# Patient Record
Sex: Female | Born: 1960 | Race: White | Hispanic: No | Smoking: Never smoker
Health system: Southern US, Community
[De-identification: ages and names within clinical notes are randomized; demographics above are authoritative.]

## PROBLEM LIST (undated history)

## (undated) DIAGNOSIS — E049 Nontoxic goiter, unspecified: Secondary | ICD-10-CM

## (undated) HISTORY — PX: SKIN CANCER EXCISION: SHX779

## (undated) HISTORY — DX: Nontoxic goiter, unspecified: E04.9

---

## 2003-05-24 ENCOUNTER — Encounter: Admission: RE | Admit: 2003-05-24 | Discharge: 2003-05-24 | Payer: Self-pay | Admitting: Internal Medicine

## 2004-02-22 ENCOUNTER — Ambulatory Visit: Payer: Self-pay | Admitting: Internal Medicine

## 2004-06-25 ENCOUNTER — Ambulatory Visit: Payer: Self-pay | Admitting: Internal Medicine

## 2005-10-21 ENCOUNTER — Ambulatory Visit: Payer: Self-pay | Admitting: Internal Medicine

## 2005-10-28 ENCOUNTER — Ambulatory Visit: Payer: Self-pay | Admitting: Internal Medicine

## 2005-11-05 ENCOUNTER — Ambulatory Visit: Payer: Self-pay | Admitting: Internal Medicine

## 2005-11-12 ENCOUNTER — Encounter: Admission: RE | Admit: 2005-11-12 | Discharge: 2005-11-12 | Payer: Self-pay | Admitting: Internal Medicine

## 2005-11-12 ENCOUNTER — Encounter: Payer: Self-pay | Admitting: Internal Medicine

## 2007-03-10 ENCOUNTER — Telehealth (INDEPENDENT_AMBULATORY_CARE_PROVIDER_SITE_OTHER): Payer: Self-pay | Admitting: *Deleted

## 2007-03-31 ENCOUNTER — Ambulatory Visit: Payer: Self-pay | Admitting: Internal Medicine

## 2007-03-31 DIAGNOSIS — E042 Nontoxic multinodular goiter: Secondary | ICD-10-CM | POA: Insufficient documentation

## 2007-04-05 LAB — CONVERTED CEMR LAB
Eosinophils Absolute: 0 10*3/uL (ref 0.0–0.6)
Eosinophils Relative: 0.5 % (ref 0.0–5.0)
GFR calc Af Amer: 87 mL/min
GFR calc non Af Amer: 72 mL/min
Glucose, Bld: 96 mg/dL (ref 70–99)
HCT: 39.5 % (ref 36.0–46.0)
HDL: 58.5 mg/dL (ref >39.0)
Lymphocytes Relative: 17.3 % (ref 12.0–46.0)
MCV: 93.3 fL (ref 78.0–100.0)
Monocytes Absolute: 0.3 10*3/uL (ref 0.2–0.7)
Neutro Abs: 4.1 10*3/uL (ref 1.4–7.7)
Neutrophils Relative %: 77 % (ref 43.0–77.0)
Platelets: 202 10*3/uL (ref 150–400)
Potassium: 5.1 meq/L (ref 3.5–5.1)
Sodium: 141 meq/L (ref 135–145)
WBC: 5.3 10*3/uL (ref 4.5–10.5)

## 2007-05-09 ENCOUNTER — Telehealth (INDEPENDENT_AMBULATORY_CARE_PROVIDER_SITE_OTHER): Payer: Self-pay | Admitting: *Deleted

## 2007-05-11 ENCOUNTER — Encounter: Admission: RE | Admit: 2007-05-11 | Discharge: 2007-05-11 | Payer: Self-pay | Admitting: Internal Medicine

## 2007-05-12 ENCOUNTER — Encounter (INDEPENDENT_AMBULATORY_CARE_PROVIDER_SITE_OTHER): Payer: Self-pay | Admitting: *Deleted

## 2007-05-13 ENCOUNTER — Telehealth (INDEPENDENT_AMBULATORY_CARE_PROVIDER_SITE_OTHER): Payer: Self-pay | Admitting: *Deleted

## 2007-05-24 ENCOUNTER — Ambulatory Visit: Payer: Self-pay | Admitting: Internal Medicine

## 2007-06-06 ENCOUNTER — Telehealth (INDEPENDENT_AMBULATORY_CARE_PROVIDER_SITE_OTHER): Payer: Self-pay | Admitting: *Deleted

## 2007-07-07 ENCOUNTER — Ambulatory Visit: Payer: Self-pay | Admitting: Internal Medicine

## 2008-03-25 LAB — HM MAMMOGRAPHY: HM Mammogram: NORMAL

## 2009-02-12 ENCOUNTER — Ambulatory Visit: Payer: Self-pay | Admitting: Internal Medicine

## 2009-02-18 ENCOUNTER — Ambulatory Visit: Payer: Self-pay | Admitting: Internal Medicine

## 2009-02-20 LAB — CONVERTED CEMR LAB
ALT: 27 units/L (ref 0–35)
AST: 24 units/L (ref 0–37)
Basophils Absolute: 0 10*3/uL (ref 0.0–0.1)
CO2: 28 meq/L (ref 19–32)
Calcium: 9 mg/dL (ref 8.4–10.5)
Chloride: 108 meq/L (ref 96–112)
Eosinophils Absolute: 0.1 10*3/uL (ref 0.0–0.7)
HDL: 54.3 mg/dL (ref >39.00)
LDL Cholesterol: 118 mg/dL — ABNORMAL HIGH (ref 0–99)
Lymphocytes Relative: 27.5 % (ref 12.0–46.0)
Lymphs Abs: 1.2 10*3/uL (ref 0.7–4.0)
Monocytes Relative: 9.1 % (ref 3.0–12.0)
Platelets: 160 10*3/uL (ref 150.0–400.0)
RDW: 12.3 % (ref 11.5–14.6)
Sodium: 142 meq/L (ref 135–145)
TSH: 6.45 microintl units/mL — ABNORMAL HIGH (ref 0.35–5.50)
Total CHOL/HDL Ratio: 3
Triglycerides: 53 mg/dL (ref 0.0–149.0)

## 2009-04-05 ENCOUNTER — Telehealth (INDEPENDENT_AMBULATORY_CARE_PROVIDER_SITE_OTHER): Payer: Self-pay | Admitting: *Deleted

## 2009-04-12 ENCOUNTER — Ambulatory Visit: Payer: Self-pay | Admitting: Internal Medicine

## 2009-10-17 ENCOUNTER — Telehealth (INDEPENDENT_AMBULATORY_CARE_PROVIDER_SITE_OTHER): Payer: Self-pay | Admitting: *Deleted

## 2009-10-18 ENCOUNTER — Ambulatory Visit: Payer: Self-pay | Admitting: Internal Medicine

## 2010-03-02 ENCOUNTER — Encounter: Payer: Self-pay | Admitting: Internal Medicine

## 2010-03-11 NOTE — Progress Notes (Signed)
Summary: lab   Phone Note Outgoing Call   Summary of Call: pls schedule lab for TSH-244.9 due for recheck.Marland KitchenMarland KitchenDoristine Devoid  April 05, 2009 4:16 PM   Follow-up for Phone Call        [facility] .Marland KitchenHarold Barban  April 05, 2009 4:19 PM    Additional Follow-up for Phone Call Additional follow up Details #2::    pt has ann appt on March 4,2011 Follow-up by: Barb Merino,  April 08, 2009 9:03 AM

## 2010-03-11 NOTE — Progress Notes (Signed)
Summary: FOR LAB TSH, USE 240.9  Phone Note Call from Patient   Summary of Call: PATIENT CALLED TO SCHEDULE LAB APPT TO RECHK TSH--PER DANIELLE, USE 240.9 Initial call taken by: Jerolyn Shin,  October 17, 2009 11:07 AM

## 2010-03-11 NOTE — Assessment & Plan Note (Signed)
Summary: cpx/ns/kdc   Vital Signs:  Patient profile:   50 year old female LMP:     02/01/2009 Height:      67 inches Weight:      137.2 pounds BMI:     21.57 Temp:     98.4 degrees F Pulse rate:   60 / minute BP sitting:   110 / 80  Vitals Entered By: Shary Decamp (February 12, 2009 1:56 PM) CC: cpx - not fasting Is Patient Diabetic? No LMP (date): 02/01/2009     Enter LMP: 02/01/2009 Last PAP Result normal   History of Present Illness: CPX self goiter exam unchanged   Preventive Screening-Counseling & Management  Alcohol-Tobacco     Alcohol drinks/day: socially     Smoking Status: never  Caffeine-Diet-Exercise     Caffeine use/day: 3 cup coffee/wk     Does Patient Exercise: yes     Times/week: 3  Current Medications (verified): 1)  Levothroid 100 Mcg  Tabs (Levothyroxine Sodium) .Marland Kitchen.. 1 By Mouth Once Daily, Must Have Ov For Additional Refills 2)  Nuvaring 0.12-0.015 Mg/24hr  Ring (Etonogestrel-Ethinyl Estradiol)  Allergies (verified): No Known Drug Allergies  Comments:  Nurse/Medical Assistant: The patient's medications and allergies were reviewed with the patient and were updated in the Medication and Allergy Lists. Shary Decamp (February 12, 2009 2:05 PM)  Past History:  Past Medical History: remote Dx of goiter, u/s 2007, repeated 05-2007  stable G1 P1  Past Surgical History: skin Ca - removed rt arm/rt leg 2008  Sees derm 4 times a year  Family History: Reviewed history from 03/31/2007 and no changes required. CAD - no HTN - no Stroke - no colon Ca - brother  breast Ca - MGM (87 y/o) DM - no M- living, excellent health F - living, excellent health  Social History: Reviewed history from 03/31/2007 and no changes required. 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 CV:  Denies chest pain or discomfort and swelling of feet. Resp:  Denies cough and shortness of breath. GI:  Denies bloody stools, nausea, and vomiting. GU:  Denies dysuria and urinary hesitancy. Psych:  Denies anxiety and depression.  Physical Exam  General:  alert, well-developed, and well-nourished.   Neck:  no masses.   Thyroid: smooth and enlarged at L, no nodular or tender Lungs:  normal respiratory effort, no intercostal retractions, no accessory muscle use, and normal breath sounds.   Heart:  normal rate, regular rhythm, no murmur, and no gallop.   Abdomen:  soft, non-tender, no distention, no masses, no guarding, and no rigidity.   Extremities:  no pretibial edema bilaterally  Psych:  Cognition and judgment appear intact. Alert and cooperative with normal attention span and concentration.  not anxious appearing and not depressed appearing.     Impression & Recommendations:  Problem # 1:  HEALTH SCREENING (ICD-V70.0) TD 2009 flu shot today doing great, encouraged more exercise  has a gyn  cscope 06-2007, neg, next 5 years d/t FH  cont healthy life style    Problem # 2:  GOITER, UNSPECIFIED (ICD-240.9) labs  Complete Medication List: 1)  Levothroid 100 Mcg Tabs (Levothyroxine sodium) .Marland Kitchen.. 1 by mouth once daily, must have ov for additional refills 2)  Nuvaring 0.12-0.015 Mg/24hr Ring (Etonogestrel-ethinyl estradiol)  Other Orders:  Admin 1st Vaccine (04540) Flu Vaccine 33yrs + (98119) Flu Vaccine Consent Questions     Do you have a history of severe allergic reactions to this vaccine? no    Any prior history of allergic reactions to egg and/or gelatin? no    Do you have a sensitivity to the preservative Thimersol? no    Do you have a past history of Guillan-Barre Syndrome? no    Do you currently have an acute febrile illness? no    Have you ever had a severe reaction to latex? no    Vaccine information  given and explained to patient? yes    Are you currently pregnant? no    Lot Number:[id number]   Exp Date:08/08/2009   Site Given  Left Deltoid IM  Patient Instructions: 1)  came back fasting: 2)  FLP CBC BMP AST ALT Dx V70 3)  TSH  Dx Goiter  4)  Please schedule a follow-up appointment in 1 year.  .lbflu   Preventive Care Screening  Pap Smear:    Date:  09/09/2008    Results:  normal   Mammogram:    Date:  03/25/2008    Results:  normal   Prior Values:    Pap Smear:  normal (07/11/2006)    Mammogram:  normal (11/10/2006)    Colonoscopy:  Location:  [facility].   (07/07/2007)    Last Tetanus Booster:  less than 10 years (03/31/2007)

## 2010-04-08 ENCOUNTER — Telehealth (INDEPENDENT_AMBULATORY_CARE_PROVIDER_SITE_OTHER): Payer: Self-pay | Admitting: *Deleted

## 2010-04-17 NOTE — Progress Notes (Signed)
Summary: need lab orders for 3/20--cannot  get early pr Dr Drue Novel  Phone Note Call from Patient   Caller: Patient Summary of Call: has appt for physical for 05/06/2010---can she come in one week earlier on 3/20 for labs?    if yes, then what labs and what codes please?? Initial call taken by: Jerolyn Shin,  April 08, 2010 11:11 AM  Follow-up for Phone Call        Does this patient have medicare? It is standard that those who do not have Medicare can do their labs early.  Code for physical has not changed, v70.0.   MD please specify labs. Follow-up by: Lucious Groves CMA,  April 08, 2010 11:48 AM    Additional Follow-up for Phone Call Additional follow up Details #2::    we are transitioning to a new system, explain patient will get labs @ the time of the visit. If so desire, she can get a rainbow in AM and visit later that day Jose E. Paz MD  April 10, 2010 5:41 PM   Additional Follow-up for Phone Call Additional follow up Details #3:: Details for Additional Follow-up Action Taken: left detailed message for patient to call us back if she wants to come in early for "rainbow" labs, advised her that she can have labs on day of physical or she can come back another day after her physical for her lab work.Jerolyn Shin  April 11, 2010 12:32 PM

## 2010-04-22 ENCOUNTER — Encounter: Payer: Self-pay | Admitting: Internal Medicine

## 2010-05-06 ENCOUNTER — Ambulatory Visit (INDEPENDENT_AMBULATORY_CARE_PROVIDER_SITE_OTHER): Payer: BC Managed Care – PPO | Admitting: Internal Medicine

## 2010-05-06 ENCOUNTER — Encounter: Payer: Self-pay | Admitting: Internal Medicine

## 2010-05-06 DIAGNOSIS — E049 Nontoxic goiter, unspecified: Secondary | ICD-10-CM

## 2010-05-06 DIAGNOSIS — Z136 Encounter for screening for cardiovascular disorders: Secondary | ICD-10-CM

## 2010-05-06 DIAGNOSIS — Z0000 Encounter for general adult medical examination without abnormal findings: Secondary | ICD-10-CM | POA: Insufficient documentation

## 2010-05-06 LAB — CBC WITH DIFFERENTIAL/PLATELET
Basophils Absolute: 0 10*3/uL (ref 0.0–0.1)
Eosinophils Absolute: 0.1 10*3/uL (ref 0.0–0.7)
Lymphocytes Relative: 19.8 % (ref 12.0–46.0)
MCHC: 34.3 g/dL (ref 30.0–36.0)
MCV: 92.1 fl (ref 78.0–100.0)
Monocytes Absolute: 0.3 10*3/uL (ref 0.1–1.0)
Neutrophils Relative %: 73.4 % (ref 43.0–77.0)
Platelets: 200 10*3/uL (ref 150.0–400.0)
RBC: 4.51 Mil/uL (ref 3.87–5.11)
RDW: 12.8 % (ref 11.5–14.6)

## 2010-05-06 LAB — BASIC METABOLIC PANEL
Chloride: 108 mEq/L (ref 96–112)
GFR: 82.4 mL/min (ref >60.00)
Potassium: 5.1 mEq/L (ref 3.5–5.1)
Sodium: 141 mEq/L (ref 135–145)

## 2010-05-06 LAB — LDL CHOLESTEROL, DIRECT: Direct LDL: 121.8 mg/dL

## 2010-05-06 LAB — LIPID PANEL
Cholesterol: 212 mg/dL — ABNORMAL HIGH (ref 0–200)
HDL: 72.8 mg/dL (ref >39.00)
Triglycerides: 97 mg/dL (ref 0.0–149.0)
VLDL: 19.4 mg/dL (ref 0.0–40.0)

## 2010-05-06 LAB — AST: AST: 20 U/L (ref 0–37)

## 2010-05-06 NOTE — Assessment & Plan Note (Addendum)
TD 2009 has a gyn  cscope 06-2007, neg, next 5 years d/t FH  EKG NSR cont healthy life style!

## 2010-05-06 NOTE — Assessment & Plan Note (Signed)
Labs, repeat u/s self goiter exam is unchanged

## 2010-05-06 NOTE — Progress Notes (Signed)
  Subjective:    Patient ID: Deborah Yates, female    DOB: [birth year], 50 y.o.   MRN: [id number]  HPI CPX Doing well, exercise x5/week , slt discouraged b/c has not loss wt   Review of Systems  Constitutional: Negative for fever, fatigue and unexpected weight change.  Respiratory: Negative for cough, shortness of breath and wheezing.   Cardiovascular: Negative for chest pain, palpitations and leg swelling.  Gastrointestinal: Negative for nausea, vomiting, diarrhea and blood in stool.  Genitourinary:       Sees gyn  Psychiatric/Behavioral:       No anxiety, depression    Past Medical History  Diagnosis Date  . Goiter     remote, u/s 2007, repeated 05/2007- stable   Past Surgical History  Procedure Date  . Skin cancer excision     removed rt arm/rt leg 2008- sees derm 4 times a year   Family History: CAD - no HTN - no Stroke - no colon Ca - brother  breast Ca - MGM (55 y/o) DM - no M- living, excellent health F - living, excellent health  Social History: Divorced 1 daughter Haroldine Laws teacher--russian literature diet--healthy! exercise--  Very active!Tobacco-- no ETOH-- socially       Objective:   Physical Exam  Constitutional: She is oriented to person, place, and time. She appears well-developed and well-nourished.  Neck: Normal range of motion. Neck supple.       Soft enlargment  L thyroid, no tender, no nodular   Cardiovascular: Normal rate, regular rhythm and normal heart sounds.   Pulmonary/Chest: Effort normal and breath sounds normal. No respiratory distress. She has no wheezes. She has no rales.  Abdominal: Soft. Bowel sounds are normal. She exhibits no distension. There is no tenderness. There is no rebound and no guarding.  Musculoskeletal: She exhibits no edema.  Neurological: She is alert and oriented to person, place, and time. She has normal reflexes.  Psychiatric: She has a normal mood and affect. Her behavior is normal. Judgment normal.           Assessment & Plan:

## 2010-05-06 NOTE — Patient Instructions (Signed)
Will schedule a u/s

## 2010-05-07 ENCOUNTER — Telehealth: Payer: Self-pay | Admitting: *Deleted

## 2010-05-07 MED ORDER — LEVOTHYROXINE SODIUM 125 MCG PO TABS: 125.0000 ug | ORAL_TABLET | Freq: Every day | ORAL | Status: DC

## 2010-05-07 NOTE — Telephone Encounter (Signed)
Message copied by Army Fossa on Wed May 07, 2010 11:56 AM ------      Message from: Deborah Yates      Created: Tue May 06, 2010  8:59 PM       Advise patient:      All labs ok, just need to take less synthroid.      Currently on daily, change to daily      Arrange for a tsh in 2 months

## 2010-05-07 NOTE — Telephone Encounter (Signed)
Spoke w/ pt & she is aware

## 2010-05-07 NOTE — Telephone Encounter (Signed)
Left message for pt to call back  °

## 2010-05-09 ENCOUNTER — Other Ambulatory Visit: Payer: BC Managed Care – PPO

## 2010-06-18 ENCOUNTER — Ambulatory Visit
Admission: RE | Admit: 2010-06-18 | Discharge: 2010-06-18 | Disposition: A | Discharge status: Home / Self Care | Payer: BC Managed Care – PPO | Source: Ambulatory Visit | Attending: Internal Medicine | Admitting: Internal Medicine

## 2010-06-18 DIAGNOSIS — E049 Nontoxic goiter, unspecified: Secondary | ICD-10-CM

## 2010-08-19 ENCOUNTER — Other Ambulatory Visit: Payer: Self-pay | Admitting: Internal Medicine

## 2010-08-20 ENCOUNTER — Other Ambulatory Visit: Payer: Self-pay | Admitting: Internal Medicine

## 2010-08-20 DIAGNOSIS — E039 Hypothyroidism, unspecified: Secondary | ICD-10-CM

## 2010-08-21 ENCOUNTER — Other Ambulatory Visit (INDEPENDENT_AMBULATORY_CARE_PROVIDER_SITE_OTHER): Payer: BC Managed Care – PPO

## 2010-08-21 DIAGNOSIS — E039 Hypothyroidism, unspecified: Secondary | ICD-10-CM

## 2010-08-21 LAB — TSH: TSH: 0.14 u[IU]/mL — ABNORMAL LOW (ref 0.35–5.50)

## 2010-08-21 NOTE — Progress Notes (Signed)
Labs only

## 2010-08-25 ENCOUNTER — Telehealth: Payer: Self-pay | Admitting: *Deleted

## 2010-08-25 MED ORDER — LEVOTHYROXINE SODIUM 100 MCG PO TABS: 100.0000 ug | ORAL_TABLET | Freq: Every day | ORAL | Status: DC

## 2010-08-25 NOTE — Telephone Encounter (Signed)
Message left for patient to return my call.  

## 2010-08-25 NOTE — Telephone Encounter (Signed)
Message copied by Leanne Lovely on Mon Aug 25, 2010  2:37 PM ------      Message from: Deborah Yates      Created: Mon Aug 25, 2010  1:24 PM       Advise patient, still needs to take less Synthroid, decreased to 100 mcg daily. Call in a new prescription, arrange a TSH in 6 weeks

## 2010-08-25 NOTE — Telephone Encounter (Signed)
Pt is aware.  

## 2010-09-20 ENCOUNTER — Other Ambulatory Visit: Payer: Self-pay | Admitting: Internal Medicine

## 2010-09-24 NOTE — Telephone Encounter (Signed)
Denied-last dosage in EMR chart on 08/25/2010 is daily.

## 2010-12-29 ENCOUNTER — Other Ambulatory Visit (INDEPENDENT_AMBULATORY_CARE_PROVIDER_SITE_OTHER): Payer: BC Managed Care – PPO

## 2010-12-29 ENCOUNTER — Other Ambulatory Visit: Payer: Self-pay | Admitting: Internal Medicine

## 2010-12-29 DIAGNOSIS — E049 Nontoxic goiter, unspecified: Secondary | ICD-10-CM

## 2011-04-13 ENCOUNTER — Other Ambulatory Visit: Payer: Self-pay | Admitting: Internal Medicine

## 2011-04-13 NOTE — Telephone Encounter (Signed)
Refill done.  

## 2011-05-19 ENCOUNTER — Telehealth: Payer: Self-pay | Admitting: Internal Medicine

## 2011-05-19 NOTE — Telephone Encounter (Signed)
Schedule a CPX, will do all labs then

## 2011-05-19 NOTE — Telephone Encounter (Signed)
It looks like the pt is due for a CPE as well as labs. Please advise.

## 2011-05-19 NOTE — Telephone Encounter (Signed)
CPE scheduled for July. [place].

## 2011-05-19 NOTE — Telephone Encounter (Signed)
Can you please call pt & schedule CPE? Thank you!

## 2011-05-19 NOTE — Telephone Encounter (Signed)
Patient stated that she is due for labs to continue getting her thyroid medications, can you please review the chart & get orders for labs & I will call the patient back to set up an appointment. Thanks Patient PH# [phone]

## 2011-05-28 ENCOUNTER — Other Ambulatory Visit: Payer: Self-pay | Admitting: Internal Medicine

## 2011-05-28 NOTE — Telephone Encounter (Signed)
Refill done.  

## 2011-08-26 ENCOUNTER — Ambulatory Visit (INDEPENDENT_AMBULATORY_CARE_PROVIDER_SITE_OTHER): Payer: BC Managed Care – PPO | Admitting: Internal Medicine

## 2011-08-26 ENCOUNTER — Encounter: Payer: Self-pay | Admitting: Internal Medicine

## 2011-08-26 VITALS — BP 104/68 | HR 66 | Temp 98.1°F | Ht 66.25 in | Wt 129.0 lb

## 2011-08-26 DIAGNOSIS — E049 Nontoxic goiter, unspecified: Secondary | ICD-10-CM

## 2011-08-26 DIAGNOSIS — Z0000 Encounter for general adult medical examination without abnormal findings: Secondary | ICD-10-CM

## 2011-08-26 LAB — CBC WITH DIFFERENTIAL/PLATELET
Basophils Absolute: 0 10*3/uL (ref 0.0–0.1)
Eosinophils Absolute: 0.1 10*3/uL (ref 0.0–0.7)
Hemoglobin: 13.6 g/dL (ref 12.0–15.0)
Lymphocytes Relative: 23 % (ref 12.0–46.0)
MCHC: 33 g/dL (ref 30.0–36.0)
Monocytes Relative: 5.4 % (ref 3.0–12.0)
Neutrophils Relative %: 69.8 % (ref 43.0–77.0)
RBC: 4.41 Mil/uL (ref 3.87–5.11)
RDW: 13.1 % (ref 11.5–14.6)

## 2011-08-26 LAB — COMPREHENSIVE METABOLIC PANEL
AST: 19 U/L (ref 0–37)
Albumin: 3.6 g/dL (ref 3.5–5.2)
Alkaline Phosphatase: 77 U/L (ref 39–117)
Glucose, Bld: 97 mg/dL (ref 70–99)
Potassium: 4.3 mEq/L (ref 3.5–5.1)
Sodium: 139 mEq/L (ref 135–145)
Total Bilirubin: 0.9 mg/dL (ref 0.3–1.2)
Total Protein: 7 g/dL (ref 6.0–8.3)

## 2011-08-26 LAB — LIPID PANEL
Cholesterol: 217 mg/dL — ABNORMAL HIGH (ref 0–200)
Total CHOL/HDL Ratio: 3
Triglycerides: 66 mg/dL (ref 0.0–149.0)
VLDL: 13.2 mg/dL (ref 0.0–40.0)

## 2011-08-26 LAB — TSH: TSH: 0.57 u[IU]/mL (ref 0.35–5.50)

## 2011-08-26 NOTE — Patient Instructions (Addendum)
Next visit in one year as long as you feel well and the blood work is normal.

## 2011-08-26 NOTE — Progress Notes (Signed)
  Subjective:    Patient ID: Deborah Yates, female    DOB: 12-25-1960, 51 y.o.   MRN: [id number]  HPI Complete physical exam, feeling great.  Past Medical History: GOITER, dx remotely-->  u/s 2007, repeated 05-2007  stable G1 P1  Past Surgical History: skin Ca - removed rt arm/rt leg 2008  Sees derm 4 times a year  Family History:  CAD - no  HTN - no  Stroke - no  colon Ca - brother  breast Ca - MGM (53 y/o)  DM - no  M- living, excellent health  F - living, excellent health   Social History:  Divorced , 1 daughter  [employer] teacher--russian literature , speaks polish,  [place]. diet--healthy!  exercise-- Very active! Tobacco-- no  ETOH-- socially    Review of Systems No chest pain or shortness or breath No nausea, vomiting, diarrhea. No dysuria or gross hematuria. No anxiety  depression.     Objective:   Physical Exam  General -- alert, well-developed, and well-nourished.   Neck --left thyroid is slightly enlarged without nodules. Lungs -- normal respiratory effort, no intercostal retractions, no accessory muscle use, and normal breath sounds.   Heart-- normal rate, regular rhythm, no murmur, and no gallop.   Abdomen--soft, non-tender, no distention, has a palpable aorta in the upper abdomen, no bruit, nontender. Size seems to be within normal. Extremities-- no pretibial edema bilaterally Neurologic-- alert & oriented X3 and strength normal in all extremities. Psych-- Cognition and judgment appear intact. Alert and cooperative with normal attention span and concentration.  not anxious appearing and not depressed appearing.       Assessment & Plan:

## 2011-08-26 NOTE — Assessment & Plan Note (Addendum)
TD 2009 has a gyn  cscope 06-2007, neg, next 5 years d/t FH  Has a palpable, nontender Aorta, likely palpable due to her small habitus Very active, just did a 500 miles hike  in 40 days !

## 2011-08-26 NOTE — Assessment & Plan Note (Signed)
Check a TSH, continue with Synthroid last ultrasound 2012 showed that the nodules were decreased in size.

## 2012-01-11 ENCOUNTER — Telehealth: Payer: Self-pay | Admitting: Internal Medicine

## 2012-01-11 MED ORDER — LEVOTHYROXINE SODIUM 100 MCG PO TABS: 100.0000 ug | ORAL_TABLET | Freq: Every day | ORAL | Status: DC

## 2012-01-11 NOTE — Addendum Note (Signed)
Addended by: Edwena Felty T on: 01/11/2012 02:14 PM   Modules accepted: Orders

## 2012-01-11 NOTE — Telephone Encounter (Signed)
Refill: levothyroxine 100 mcg tablet. Take 1 tablet by mouth daily. Qty 90. Last fill 09-05-11

## 2012-01-11 NOTE — Telephone Encounter (Signed)
Refill done.  

## 2012-03-26 ENCOUNTER — Other Ambulatory Visit: Payer: Self-pay

## 2012-04-11 ENCOUNTER — Ambulatory Visit (INDEPENDENT_AMBULATORY_CARE_PROVIDER_SITE_OTHER): Payer: BC Managed Care – PPO | Admitting: Internal Medicine

## 2012-04-11 VITALS — BP 128/82 | HR 79 | Wt 139.0 lb

## 2012-04-11 DIAGNOSIS — E041 Nontoxic single thyroid nodule: Secondary | ICD-10-CM

## 2012-04-11 DIAGNOSIS — E049 Nontoxic goiter, unspecified: Secondary | ICD-10-CM

## 2012-04-11 LAB — BASIC METABOLIC PANEL
BUN: 15 mg/dL (ref 6–23)
Chloride: 103 mEq/L (ref 96–112)
Creatinine, Ser: 0.7 mg/dL (ref 0.4–1.2)
Glucose, Bld: 83 mg/dL (ref 70–99)

## 2012-04-11 LAB — CBC WITH DIFFERENTIAL/PLATELET
Basophils Absolute: 0 10*3/uL (ref 0.0–0.1)
Eosinophils Absolute: 0.1 10*3/uL (ref 0.0–0.7)
Eosinophils Relative: 0.8 % (ref 0.0–5.0)
Lymphs Abs: 1.3 10*3/uL (ref 0.7–4.0)
MCHC: 33.7 g/dL (ref 30.0–36.0)
MCV: 91.5 fl (ref 78.0–100.0)
Monocytes Absolute: 0.4 10*3/uL (ref 0.1–1.0)
Neutrophils Relative %: 74.6 % (ref 43.0–77.0)
Platelets: 217 10*3/uL (ref 150.0–400.0)
RDW: 12.8 % (ref 11.5–14.6)
WBC: 6.9 10*3/uL (ref 4.5–10.5)

## 2012-04-11 LAB — TSH: TSH: 6.97 u[IU]/mL — ABNORMAL HIGH (ref 0.35–5.50)

## 2012-04-11 NOTE — Assessment & Plan Note (Addendum)
Several decades history of goiter, stable on Synthroid for years, last ultrasound 2012 showing decreased size of the nodules. Presents with an acute onset of a tender thyroid nodule by physical exam. Etiology not clear, doubt septic nodule, further eval is needed Plan: Labs Ultrasound Endocrinology referral NSAIDs

## 2012-04-11 NOTE — Progress Notes (Signed)
  Subjective:    Patient ID: Deborah Yates, female    DOB: 02-15-1960, 52 y.o.   MRN: [id number]  HPI Acute visit 3 days ago had mild sinus congestion, left ear pain, felt tired. 2 days ago developed some ill-defined pain at the left frontal neck, constant, different from a muscle skeletal pain. This morning, noted L side of her thyroid to be enlarged and tender. This afternoon looks even slightly bigger than it did this morning.   Past Medical History: GOITER, dx remotely-->  u/s 2007,  05-2007, 2012 G1 P1  Past Surgical History: skin Ca - removed rt arm/rt leg 2008  Sees derm 4 times a year  Family History:   CAD - no   HTN - no   Stroke - no   colon Ca - brother   breast Ca - MGM (65 y/o)   DM - no   M- living, excellent health   F - living, excellent health   Social History:   Divorced , 1 daughter   [employer] teacher--russian literature , speaks polish,  [place]. Tobacco-- no   ETOH-- socially    Review of Systems No fever chills. No sore throat, runny nose, cough or  dental pain ,no recent dental work. No chest pain or palpitations No nausea, vomiting, diarrhea.    Objective:   Physical Exam  Neck:     General -- alert, well-developed  Neck --see graphic, no LADs HEENT -- TMs normal, throat w/o redness, face symmetric and not tender to palpation, no dental pain to percussion Lungs -- normal respiratory effort, no intercostal retractions, no accessory muscle use, and normal breath sounds.   Heart-- normal rate, regular rhythm, no murmur, and no gallop.    Neurologic-- alert & oriented X3 and strength normal in all extremities. No tremor Psych-- Cognition and judgment appear intact. Alert and cooperative with normal attention span and concentration.  not anxious appearing and not depressed appearing.       Assessment & Plan:

## 2012-04-11 NOTE — Patient Instructions (Addendum)
Will schedule a u/s of the thyroid Motrin 200 mg 2 tablets every 6 hours as needed for pain. Always take it with food. Watch for stomach side effects (gastritis): nausea, stomach pain, change in the color of stools. Call if fever, chills, area keeps enlarging

## 2012-04-12 ENCOUNTER — Telehealth: Payer: Self-pay | Admitting: Internal Medicine

## 2012-04-12 ENCOUNTER — Encounter: Payer: Self-pay | Admitting: Internal Medicine

## 2012-04-12 LAB — SEDIMENTATION RATE: Sed Rate: 20 mm/hr (ref 0–22)

## 2012-04-12 NOTE — Telephone Encounter (Signed)
Error. BC °

## 2012-04-13 ENCOUNTER — Ambulatory Visit (HOSPITAL_BASED_OUTPATIENT_CLINIC_OR_DEPARTMENT_OTHER)
Admission: RE | Admit: 2012-04-13 | Discharge: 2012-04-13 | Disposition: A | Discharge status: Home / Self Care | Payer: BC Managed Care – PPO | Source: Ambulatory Visit | Attending: Internal Medicine | Admitting: Internal Medicine

## 2012-04-13 DIAGNOSIS — E041 Nontoxic single thyroid nodule: Secondary | ICD-10-CM | POA: Insufficient documentation

## 2012-04-14 MED ORDER — LEVOTHYROXINE SODIUM 125 MCG PO TABS: 125.0000 ug | ORAL_TABLET | Freq: Every day | ORAL | Status: DC

## 2012-04-14 NOTE — Addendum Note (Signed)
Addended by: Edwena Felty T on: 04/14/2012 04:39 PM   Modules accepted: Orders

## 2012-04-30 ENCOUNTER — Telehealth: Payer: Self-pay | Admitting: Internal Medicine

## 2012-04-30 NOTE — Telephone Encounter (Signed)
Was seen w/ a tender enlarged thyroid, please check on patient, better? Let me know. Also, be sure she has a endocrinology appointment

## 2012-05-02 NOTE — Telephone Encounter (Signed)
lmovm for pt to return call.  

## 2012-05-03 ENCOUNTER — Ambulatory Visit (INDEPENDENT_AMBULATORY_CARE_PROVIDER_SITE_OTHER): Payer: BC Managed Care – PPO | Admitting: Internal Medicine

## 2012-05-03 ENCOUNTER — Encounter: Payer: Self-pay | Admitting: Internal Medicine

## 2012-05-03 VITALS — BP 106/58 | HR 63 | Temp 97.6°F | Resp 10 | Ht 66.5 in | Wt 138.0 lb

## 2012-05-03 DIAGNOSIS — E042 Nontoxic multinodular goiter: Secondary | ICD-10-CM

## 2012-05-03 DIAGNOSIS — E039 Hypothyroidism, unspecified: Secondary | ICD-10-CM

## 2012-05-03 NOTE — Patient Instructions (Signed)
Please return in a year, but let me know if you develop swelling of your neck, problems with swallowing, or shortness of breath with lying down, which might be caused by an enlarging thyroid nodule. We'll aim to repeat your thyroid ultrasound in another 1 or 2 years. In the meantime, please come to have your thyroid tests checked in 4 weeks. I will let you know the results of your tests through my chart. Please send me a message or call me with any questions.

## 2012-05-03 NOTE — Telephone Encounter (Signed)
thx

## 2012-05-03 NOTE — Progress Notes (Signed)
Subjective:     Patient ID: Deborah Yates, female   DOB: [birth year], 52 y.o.   MRN: [id number]  HPI Deborah Yates is a very pleasant 52 year old woman, referred by her PCP, Dr. Porfirio Oar, for management of hypothyroidism and multinodular goiter.  She was dx with MNG in her 31s. Started taking Synthroid 100 mcg in mid-20s to suppress the growth of the nodules, and has been on same dose of Synthroid since then. Her levels were very stable on this dose of Synthroid. 3 weeks ago, she experienced an upper respiratory infection, and felt sinus congestion, left ear pain, fatigue, left anterior neck pain, and felt that the left side of her thyroid was enlarged and tender. She was seen acutely by her PCP, who ordered a TSH, which returned elevated at 6.97 (04/11/2012), previously normal at 0.57. Her free T3 was 2.9, free T4 was 0.76, ESR was 20. A BMP and CBC with differential were normal. Her Synthroid was increased to 125 mcg 3 weeks ago.  She had a new thyroid ultrasound performed (04/14/2012): multiple bilateral nodules. We reviewed the images of the ultrasound together. I also discussed with the radiologist on call about her ultrasound prior to patient's visit.  R mid pole nodule 2.2 x 1.6 x 1.2 cm, previously 15 x 9 x 8 mm.   Multiple isthmic nodules, largest 2.1 cm in the right side of the isthmus, previously 9 x 7 x 4 mm (per radiologist, this nodule did not change much in size, upon review of the new and old images)  L superior pole complex nodule 3.5 x 2.6 x 2.6 cm, previously 4.1 x 2.8 x 2.6 cm  L inferior complex pole nodule 3.5 x 3.2 x 2.2 cm, previously 2.9 x 2.5 x 1.8 cm   Lymphadenopathy: None visualized.  She was having regular thyroid ultrasound in the past, last in 2012.  She does feel now that her neck swelling and pain have resolved, and she denies any symptoms of hypo-or hyperthyroidism. She does mention that she gained few pounds recently, but she is changing her diet to correct  this. She exercises regularly, walks, goes to the gym, does yoga. Diet is mostly healthy.   Review of Systems Constitutional: little weight gain, no fatigue, no subjective hyperthermia/hypothermia Eyes: no blurry vision, no xerophthalmia ENT: no sore throat, no nodules palpated in throat, no dysphagia/odynophagia, no hoarseness Cardiovascular: no CP/SOB/palpitations/leg swelling Respiratory: no cough/SOB Gastrointestinal: no N/V/D/C Musculoskeletal: no muscle/joint aches Skin: no rashes Neurological: no tremors/numbness/tingling/dizziness Psychiatric: no depression/anxiety  Past Medical History  Diagnosis Date  . Goiter     remote, u/s 2007, repeated 05/2007- stable   Past Surgical History  Procedure Laterality Date  . Skin cancer excision      removed rt arm/rt leg 2008- sees derm 4 times a year   History   Social History  . Marital Status: Divorced    Spouse Name: N/A    Number of Children: 1  . Years of Education: N/A   Occupational History  . teacher [employer]    [employer]   Social History Main Topics  . Smoking status: Never Smoker   . Smokeless tobacco: Never Used  . Alcohol Use: Yes     Comment: socially  . Drug Use: Not on file  . Sexually Active: Not on file   Other Topics Concern  . Not on file   Social History Narrative   Diet-healthy most of the year   Exercise- active, no routine exercise  Current Outpatient Prescriptions on File Prior to Visit  Medication Sig Dispense Refill  . etonogestrel-ethinyl estradiol (NUVARING) 0.12-0.015 MG/24HR vaginal ring Place 1 each vaginally every 28 (twenty-eight) days. Insert vaginally and leave in place for 3 consecutive weeks, then remove for 1 week.       . levothyroxine (SYNTHROID, LEVOTHROID) 125 MCG tablet Take 1 tablet (125 mcg total) by mouth daily.  90 tablet  0  . levothyroxine (SYNTHROID, LEVOTHROID) 100 MCG tablet Take 1 tablet (100 mcg total) by mouth daily.  90 tablet  0   No current  facility-administered medications on file prior to visit.   No Known Allergies  Family History  Problem Relation Age of Onset  . Coronary artery disease Neg Hx   . Hypertension Neg Hx   . Stroke Neg Hx   . Colon cancer Brother   . Breast cancer Maternal Grandmother 80  . Diabetes Neg Hx    Objective:   Physical Exam BP 106/58  Pulse 63  Temp(Src) 97.6 F (36.4 C) (Oral)  Resp 10  Ht 5' 6.5" (1.689 m)  Wt 138 lb (62.596 kg)  BMI 21.94 kg/m2  SpO2 95%  LMP 04/13/2012 Constitutional: overweight, in NAD Eyes: PERRLA, EOMI, no exophthalmos ENT: moist mucous membranes, + thyromegaly L>R, palpated nodules bilaterally, no cervical lymphadenopathy Cardiovascular: RRR, No MRG Respiratory: CTA B Gastrointestinal: abdomen soft, NT, ND, BS+ Musculoskeletal: no deformities, strength intact in all 4 Skin: moist, warm, no rashes Neurological: no tremor with outstretched hands, DTR normal in all 4    Assessment:     1. Nontoxic MNG  - thyroid ultrasound (04/14/2012): Multiple bilateral nodules. None with internal blood flow or calcifications. All well delimited.  R mid pole nodule 2.2 x 1.6 x 1.2 cm, previously 15 x 9 x 8 mm (this is mostly isoechoic, with a hypoechoic part)  Multiple isthmic nodules, largest 2.1 cm in the right side of the isthmus, previously 9 x 7 x 4 mm (per radiologist, this nodule did not change much in size, upon review of the new and old images). This does contain colloid crystals, better visible on 2012 U/S, no posterior shadowing.  L superior pole complex nodule 3.5 x 2.6 x 2.6 cm, previously 4.1 x 2.8 x 2.6 cm  L inferior complex pole nodule 3.5 x 3.2 x 2.2 cm, previously 2.9 x 2.5 x 1.8 cm   Lymphadenopathy: None visualized.  Discussed with radiology >> would just follow for now.  2. Mild hypothyroidism - patient has been on Synthroid for many years, and apparently for thyroid nodule size control - Recently had upper respiratory infection and enlarging  left thyroid lobe. TSH checked and returned at 6.97 (dose of Synthroid increased from 100-125 mcg daily), free T4 and free T3 normal. ESR 20.    Plan:     1. NT MNG - patient with multiple thyroid nodules, with recent thyroid ultrasound showing both increase and decrease in size in some of the nodules (please see above). I reviewed the images with the patient and previously with the radiologist. On the right side of her thyroid, there is only one nodule, and this is mostly isoechoic, well delimited, however this might have grown a little since last ultrasound. In the isthmus, there are several small nodules, of which the largest one is hypoechoic, measuring 2.1 cm. In the previous ultrasound, from 2012, this was measured as smaller, but per discussion with the radiologist, this has been a subjective measurement, and he does not think that  this has changed significantly. Other than the hypoechogenicity, this nodule does not have concerning features (has colloid crystals, but no calcifications). Patient's right thyroid lobe contains 2 large complex nodules, none of the significantly changed from the previous ultrasound. They contain mostly fluid plus hyperechoic material. None of these nodules have increased internal blood flow. I discussed all of the above with the patient as we reviewed the images. I recommended that we meet again in a year and might repeat the ultrasound then, or in 2 years from now. I believe that the enlargement of the left side of her thyroid was caused by increased in the fluid content of one of the left nodules, which resolved with resolving URI and anti-inflammatory treatment.  2. Mild hypothyroidism - the patient has been found to have a slightly high TSH in the context of her recent URI, and her Synthroid dose has been increased. I would like to repeat her thyroid tests in about 7 weeks from the change in dose, to make sure that she is well replaced. I placed these orders and she  will return to our clinic to have them drawn and I will send her the lab results through my chart.  Return to clinic in a year. I advised her to let me know if she developed neck compressive symptoms.

## 2012-05-03 NOTE — Telephone Encounter (Signed)
Spoke with pt, she states she is doing much better & has an appt today to see an endocrinologist.

## 2012-06-16 ENCOUNTER — Encounter: Payer: Self-pay | Admitting: *Deleted

## 2012-06-16 ENCOUNTER — Other Ambulatory Visit (INDEPENDENT_AMBULATORY_CARE_PROVIDER_SITE_OTHER): Payer: BC Managed Care – PPO

## 2012-06-16 DIAGNOSIS — E039 Hypothyroidism, unspecified: Secondary | ICD-10-CM

## 2012-06-16 DIAGNOSIS — E042 Nontoxic multinodular goiter: Secondary | ICD-10-CM

## 2012-07-26 ENCOUNTER — Encounter: Payer: Self-pay | Admitting: Internal Medicine

## 2012-08-04 ENCOUNTER — Other Ambulatory Visit: Payer: Self-pay | Admitting: Internal Medicine

## 2012-08-04 NOTE — Telephone Encounter (Signed)
Refill done.  

## 2012-12-15 ENCOUNTER — Other Ambulatory Visit: Payer: Self-pay

## 2013-03-08 ENCOUNTER — Encounter: Payer: Self-pay | Admitting: Internal Medicine

## 2013-05-15 ENCOUNTER — Other Ambulatory Visit: Payer: Self-pay | Admitting: Internal Medicine

## 2013-07-11 ENCOUNTER — Other Ambulatory Visit: Payer: Self-pay | Admitting: Internal Medicine

## 2013-07-11 DIAGNOSIS — E039 Hypothyroidism, unspecified: Secondary | ICD-10-CM

## 2013-07-12 NOTE — Telephone Encounter (Signed)
Refill for synthroid sent to [facility]

## 2014-06-23 IMAGING — US US SOFT TISSUE HEAD/NECK
1 series · 14 of 25 positions shown · non-contrast
Comparison: 06/18/2010

CLINICAL DATA: Acute enlargement of left thyroid nodule.
Tenderness within the left side of the neck.

THYROID ULTRASOUND
TECHNIQUE: Ultrasound examination of the thyroid gland and adjacent
soft tissues was performed.

[Series 1: us soft tissue head/neck · 0.08mm/px · 14 of 52 slices shown]
[im 1/52]
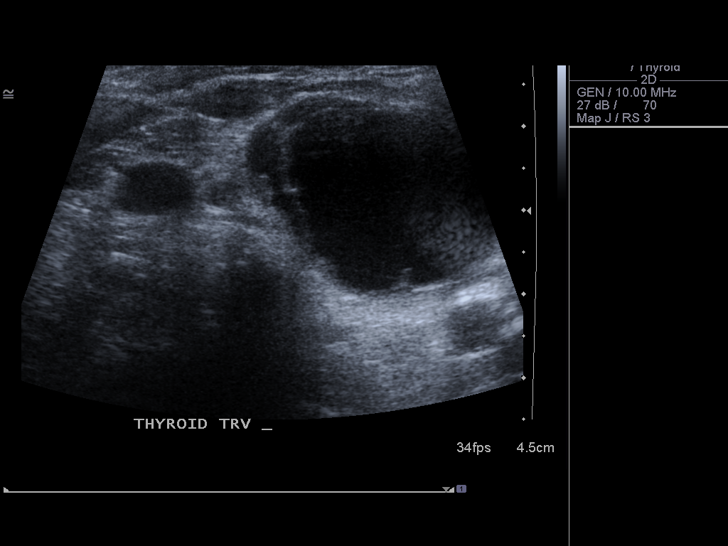
[im 5/52]
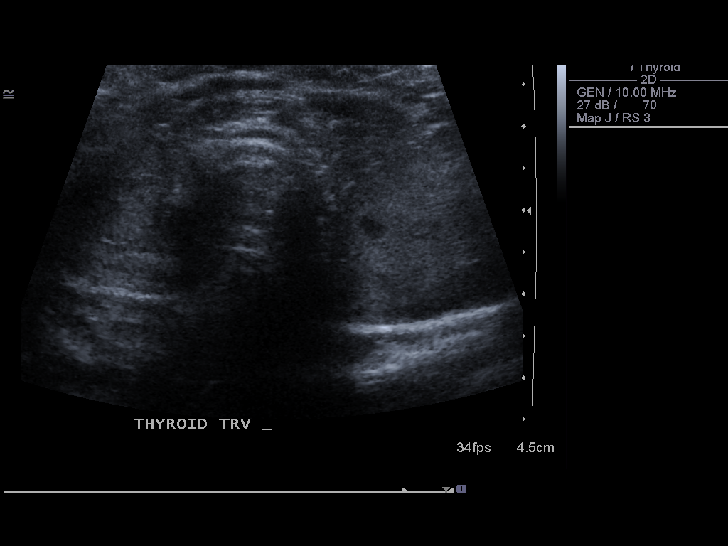
[im 9/52]
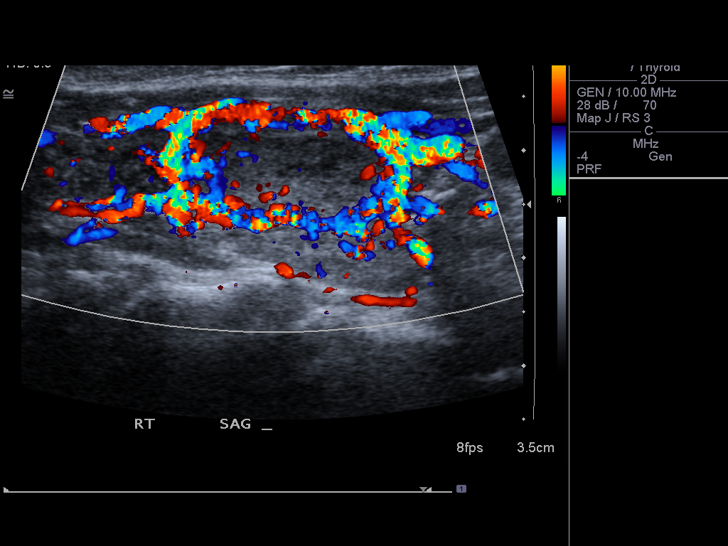
[im 13/52]
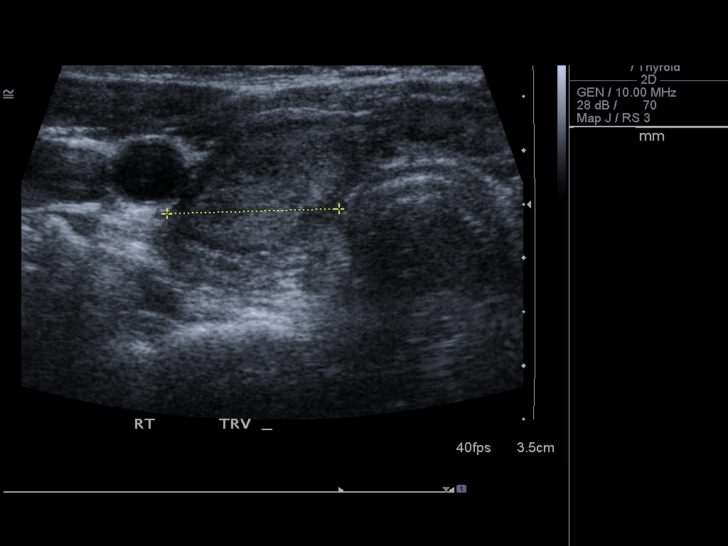
[im 18/52]
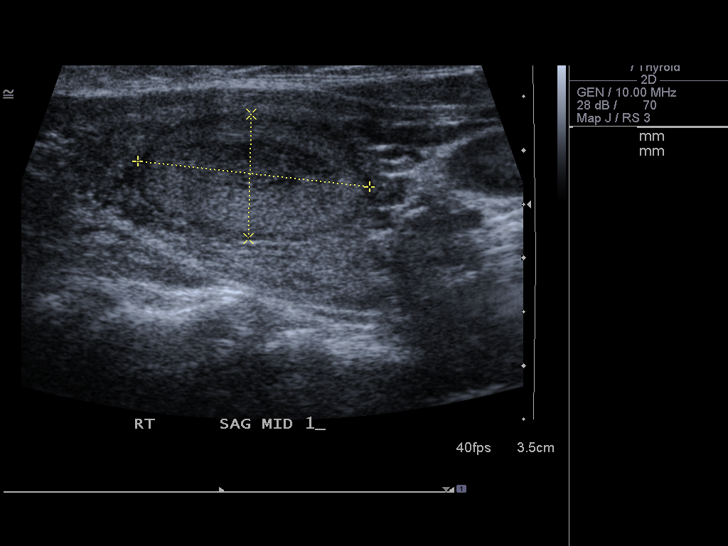
[im 20/52]
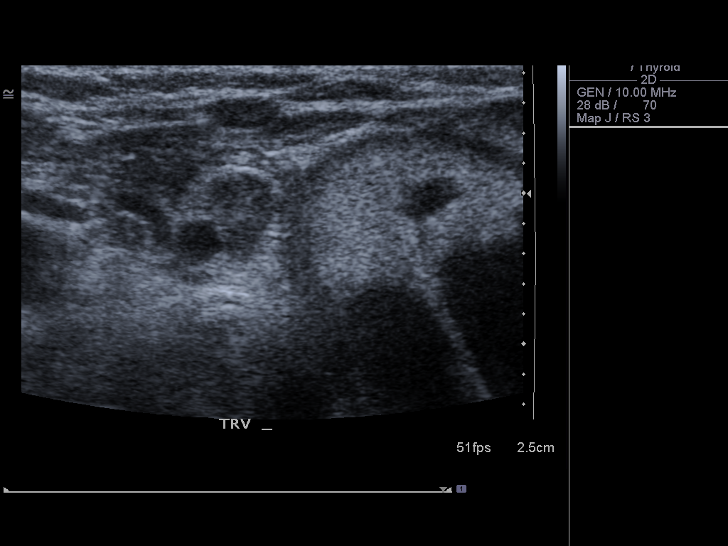
[im 24/52]
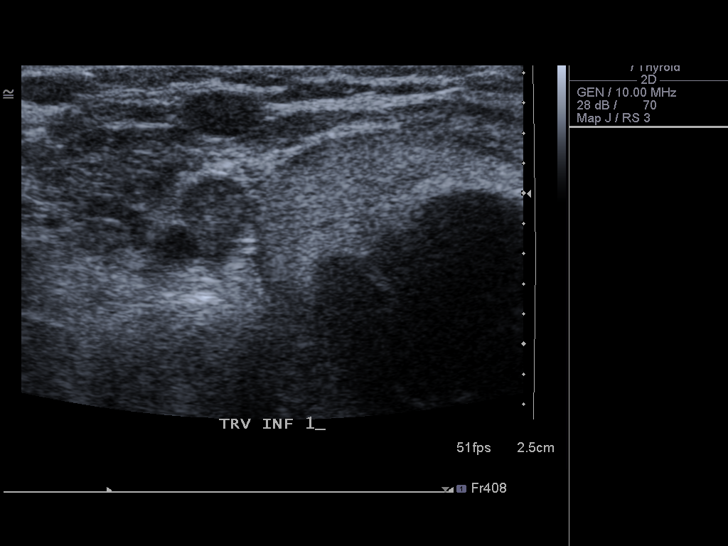
[im 28/52]
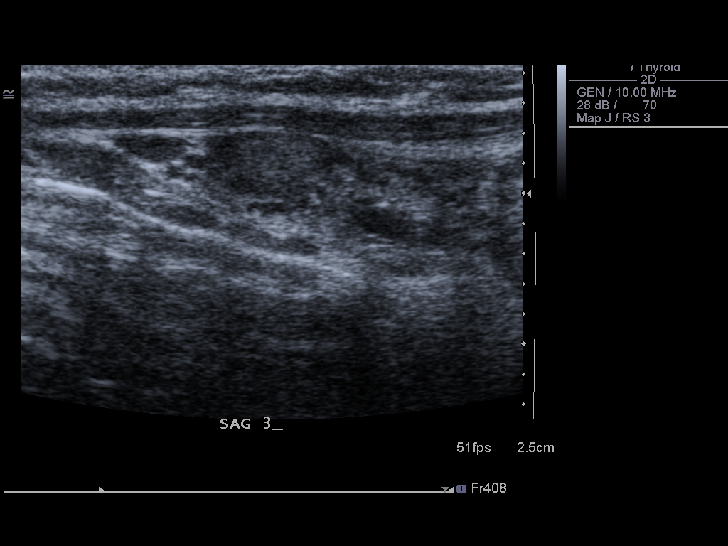
[im 32/52]
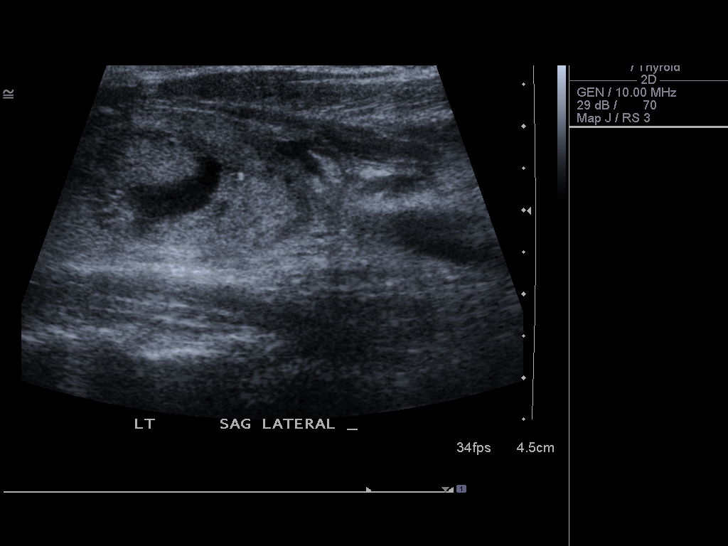
[im 35/52]
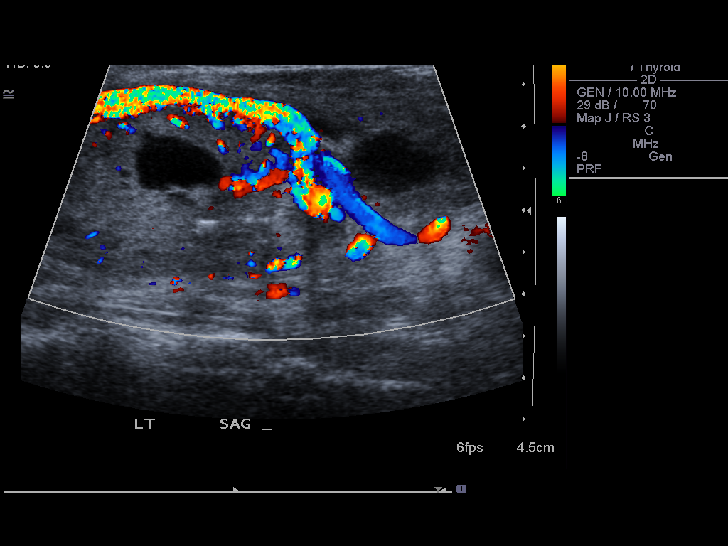
[im 39/52]
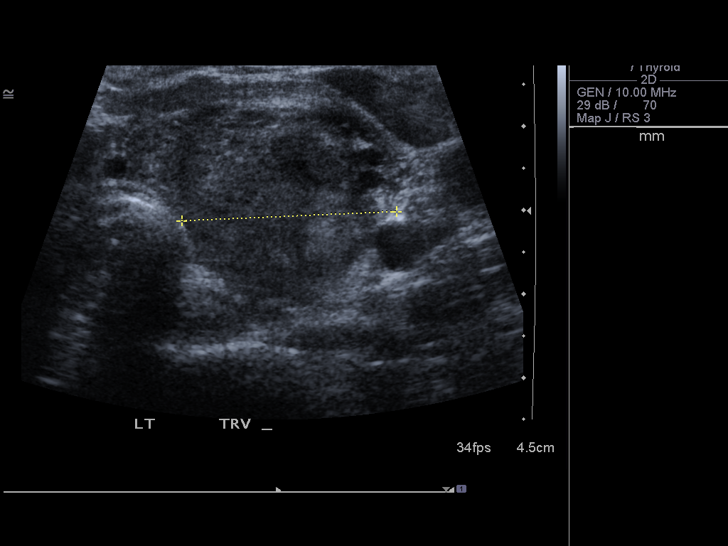
[im 43/52]
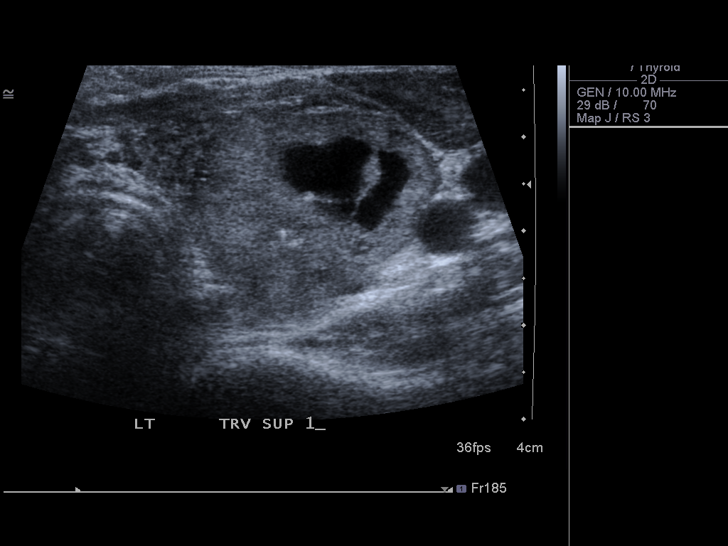
[im 47/52]
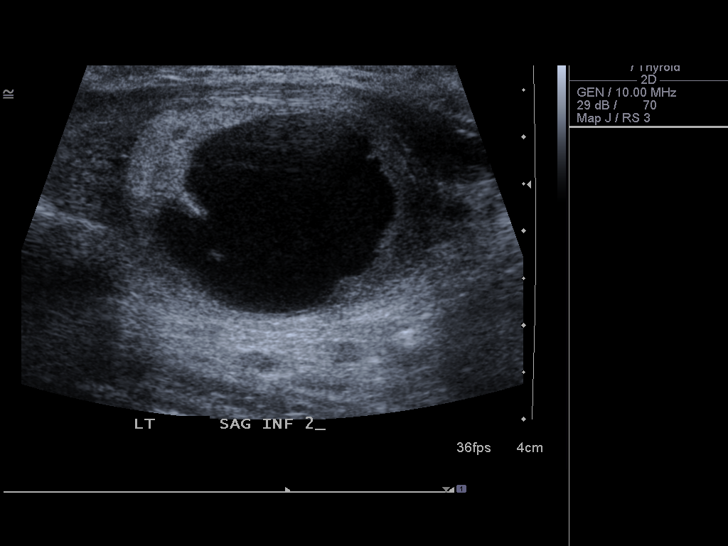
[im 52/52]
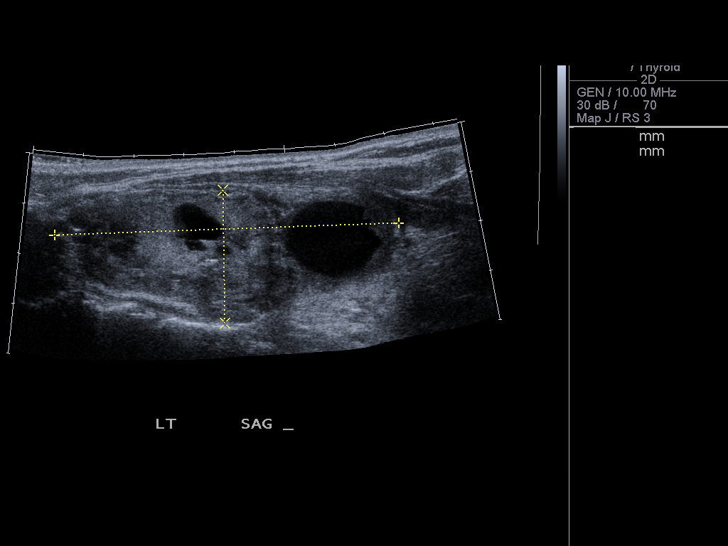

[14 of 25 positions shown; findings below may reference images not displayed]

FINDINGS: Right thyroid lobe:  4.4 x 2.0 x 1.6 cm.
Left thyroid lobe:  6.8 x 2.6 x 2.6 cm.
Isthmus:  6 mm.

Focal nodules:  Multiple bilateral nodules.  Right mid pole nodule
measures 2.2 x 1.6 x 1.2 cm.  This previously measured 15 x 9 x 8
mm.  Multiple isthmic nodules, the largest 2.1 cm in the right side
of the isthmus previously 9 x 7 x 4 mm.

Complex superior left pole nodule measures 3.5 x 2.6 x 2.6 cm.
This previously measured 4.1 x 2.8 x 2.6 cm.  Complex inferior left
pole nodule measures 3.5 x 3.2 x 2.2 cm compared with 2.9 x 2.5 x
1.8 cm previously.

Lymphadenopathy:  None visualized.
IMPRESSION: Multiple bilateral thyroid nodules, the largest are within the left
thyroid lobe and are mixed cystic and solid.  Many these nodules
are stable while others have enlarged since prior CT ultrasound.
Findings most compatible with multinodular goiter.

## 2014-07-10 ENCOUNTER — Other Ambulatory Visit: Payer: Self-pay | Admitting: Internal Medicine

## 2014-07-10 ENCOUNTER — Telehealth: Payer: Self-pay | Admitting: Internal Medicine

## 2014-07-10 NOTE — Telephone Encounter (Signed)
Informed Pt that Dr. Larose Kells will NOT be able to send in any medications because he has not seen her in over 2 years. Informed her that she may transfer to any provider and we can send her medical records to them. Pt verbalized understanding.

## 2014-07-10 NOTE — Telephone Encounter (Signed)
Please recommend her to see a doctor closer to home . I won't  be able to prescribe medication.

## 2014-07-10 NOTE — Telephone Encounter (Signed)
FYI. Please advise.

## 2014-07-10 NOTE — Telephone Encounter (Signed)
Caller name: Ameliyah Sarno Relationship to patient: self Can be reached: [phone] Pharmacy: [facility]  Reason for call: Pt is going on a trip and asked if anit-nausea medication can be prescribed. Last appt showing as 04/11/2012. Advised her should would likely need appt. She states that we are really far away at our new location. She is also inquiring about transferring physician.

## 2014-08-10 ENCOUNTER — Encounter: Payer: Self-pay | Admitting: Internal Medicine

## 2014-10-23 ENCOUNTER — Ambulatory Visit (INDEPENDENT_AMBULATORY_CARE_PROVIDER_SITE_OTHER): Payer: BC Managed Care – PPO | Admitting: Internal Medicine

## 2014-10-23 ENCOUNTER — Encounter: Payer: Self-pay | Admitting: Internal Medicine

## 2014-10-23 VITALS — BP 116/72 | HR 64 | Temp 97.6°F | Ht 66.75 in | Wt 120.2 lb

## 2014-10-23 DIAGNOSIS — Z1211 Encounter for screening for malignant neoplasm of colon: Secondary | ICD-10-CM

## 2014-10-23 DIAGNOSIS — E049 Nontoxic goiter, unspecified: Secondary | ICD-10-CM

## 2014-10-23 DIAGNOSIS — Z0000 Encounter for general adult medical examination without abnormal findings: Secondary | ICD-10-CM

## 2014-10-23 LAB — LIPID PANEL
CHOL/HDL RATIO: 3
Cholesterol: 212 mg/dL — ABNORMAL HIGH (ref 0–200)
HDL: 78.3 mg/dL (ref >39.00)
LDL CALC: 123 mg/dL — AB (ref 0–99)
NonHDL: 133.74
Triglycerides: 54 mg/dL (ref 0.0–149.0)
VLDL: 10.8 mg/dL (ref 0.0–40.0)

## 2014-10-23 LAB — COMPREHENSIVE METABOLIC PANEL
ALT: 12 U/L (ref 0–35)
AST: 15 U/L (ref 0–37)
Albumin: 4.5 g/dL (ref 3.5–5.2)
Alkaline Phosphatase: 52 U/L (ref 39–117)
BUN: 17 mg/dL (ref 6–23)
CALCIUM: 9.7 mg/dL (ref 8.4–10.5)
CHLORIDE: 104 meq/L (ref 96–112)
CO2: 30 meq/L (ref 19–32)
CREATININE: 0.85 mg/dL (ref 0.40–1.20)
GFR: 74.38 mL/min (ref >60.00)
Glucose, Bld: 92 mg/dL (ref 70–99)
POTASSIUM: 4.5 meq/L (ref 3.5–5.1)
Sodium: 142 mEq/L (ref 135–145)
Total Bilirubin: 0.7 mg/dL (ref 0.2–1.2)
Total Protein: 7.3 g/dL (ref 6.0–8.3)

## 2014-10-23 LAB — TSH: TSH: 10.58 u[IU]/mL — ABNORMAL HIGH (ref 0.35–4.50)

## 2014-10-23 LAB — T3, FREE: T3 FREE: 3.1 pg/mL (ref 2.3–4.2)

## 2014-10-23 LAB — CBC WITH DIFFERENTIAL/PLATELET
BASOS PCT: 0.6 % (ref 0.0–3.0)
Basophils Absolute: 0 10*3/uL (ref 0.0–0.1)
EOS ABS: 0.2 10*3/uL (ref 0.0–0.7)
EOS PCT: 4.9 % (ref 0.0–5.0)
HEMATOCRIT: 44.4 % (ref 36.0–46.0)
HEMOGLOBIN: 14.8 g/dL (ref 12.0–15.0)
LYMPHS PCT: 26.8 % (ref 12.0–46.0)
Lymphs Abs: 1.2 10*3/uL (ref 0.7–4.0)
MCHC: 33.3 g/dL (ref 30.0–36.0)
MCV: 99.9 fl (ref 78.0–100.0)
MONOS PCT: 5.4 % (ref 3.0–12.0)
Monocytes Absolute: 0.2 10*3/uL (ref 0.1–1.0)
NEUTROS ABS: 2.7 10*3/uL (ref 1.4–7.7)
Neutrophils Relative %: 62.3 % (ref 43.0–77.0)
PLATELETS: 197 10*3/uL (ref 150.0–400.0)
RBC: 4.44 Mil/uL (ref 3.87–5.11)
RDW: 13.2 % (ref 11.5–15.5)
WBC: 4.4 10*3/uL (ref 4.0–10.5)

## 2014-10-23 LAB — T4, FREE: FREE T4: 0.69 ng/dL (ref 0.60–1.60)

## 2014-10-23 NOTE — Progress Notes (Signed)
Subjective:    Patient ID: Deborah Yates, female    DOB: [birth year], 54 y.o.   MRN: [id number]  DOS:  10/23/2014 Type of visit - description : CPX Interval history: Since the last visit she is feeling well, no concerns. Did ran out off thyroid medication months ago    Review of Systems  Constitutional: No fever. No chills. No unexplained wt changes. No unusual sweats  HEENT: No dental problems, no ear discharge, no facial swelling, no voice changes. No eye discharge, no eye  redness , no  intolerance to light   Respiratory: No wheezing , no  difficulty breathing. No cough , no mucus production  Cardiovascular: No CP, no leg swelling , no  Palpitations  GI: no nausea, no vomiting, no diarrhea , no  abdominal pain.  No blood in the stools. No dysphagia, no odynophagia    Endocrine: No polyphagia, no polyuria , no polydipsia  GU: No dysuria, gross hematuria, difficulty urinating. No urinary urgency, no frequency.  Musculoskeletal: No joint swellings or unusual aches or pains  Skin: No change in the color of the skin, palor , no  Rash  Allergic, immunologic: No environmental allergies , no  food allergies  Neurological: No dizziness no  syncope. No headaches. No diplopia, no slurred, no slurred speech, no motor deficits, no facial  Numbness  Hematological: No enlarged lymph nodes, no easy bruising , no unusual bleedings  Psychiatry: No suicidal ideas, no hallucinations, no beavior problems, no confusion.  No unusual/severe anxiety, no depression    Past Medical History  Diagnosis Date  . Goiter     remote, u/s 2007, repeated 05/2007- stable    Past Surgical History  Procedure Laterality Date  . Skin cancer excision      removed rt arm/rt leg 2008- sees derm 4 times a year    Social History   Social History  . Marital Status: Divorced    Spouse Name: N/A  . Number of Children: 1  . Years of Education: N/A   Occupational History  . teacher [employer]    [employer]   Social History Main Topics  . Smoking status: Never Smoker   . Smokeless tobacco: Never Used  . Alcohol Use: Yes     Comment: socially  . Drug Use: Not on file  . Sexual Activity: Not on file   Other Topics Concern  . Not on file   Social History Narrative   Very healthy life style     Family History  Problem Relation Age of Onset  . Coronary artery disease Neg Hx   . Hypertension Neg Hx   . Stroke Neg Hx   . Colon cancer Brother     ~75 y/o  . Breast cancer Maternal Grandmother 59  . Diabetes Neg Hx        Medication List    Notice  As of 10/23/2014  9:19 PM   You have not been prescribed any medications.         Objective:   Physical Exam BP 116/72 mmHg  Pulse 64  Temp(Src) 97.6 F (36.4 C) (Oral)  Ht 5' 6.75" (1.695 m)  Wt 120 lb 4 oz (54.545 kg)  BMI 18.99 kg/m2  SpO2 97%  LMP 04/13/2012 General:   Well developed, well nourished . NAD.  HEENT:  Normocephalic . Face symmetric, atraumatic Neck: + Goiter, more noticeable on the left, nontender. Lungs:  CTA B Normal respiratory effort, no intercostal retractions, no accessory muscle use.  Heart: RRR,  no murmur.  no pretibial edema bilaterally  Abdomen:  Not distended, soft, non-tender. No rebound or rigidity. Palpable, nontender aorta without bruit  Skin: Not pale. Not jaundice Neurologic:  alert & oriented X3.  Speech normal, gait appropriate for age and unassisted Psych--  Cognition and judgment appear intact.  Cooperative with normal attention span and concentration.  Behavior appropriate. No anxious or depressed appearing.    Assessment & Plan:     Problem list> GOITER, dx remotely-->  u/s 2007,  and multiple other, saw endo 2014: rec to recheck a [place] ~2016 Menopausal (irreg periods as of 10-2014) G1 P1 Sees dermatology regularly   A/P Goiter: We'll check a ultrasound, check TFTs, he has not been taking thyroid medication for several months. Consider restart syndrome based  on results . Check a ultrasound

## 2014-10-23 NOTE — Patient Instructions (Signed)
Get your blood work before you leave     Next visit  for a physical exam in one year Please schedule an appointment at the front desk Please come back fasting  

## 2014-10-23 NOTE — Progress Notes (Signed)
Pre visit review using our clinic review tool, if applicable. No additional management support is needed unless otherwise documented below in the visit note. 

## 2014-10-23 NOTE — Assessment & Plan Note (Addendum)
TD 2009, flu shot at work Female care: sees gyn for PAPs, MMGs Never had a dexa: Will check in few years, she just started her menopause CCS: cscope 06-2007, neg, next was due 2014 d/t FH ----------refer to GI Has a palpable, nontender Aorta, likely palpable due to her small habitus Labs Has a very healthy lifestyle.

## 2014-10-24 LAB — HEPATITIS C ANTIBODY: HCV Ab: NEGATIVE

## 2014-10-24 LAB — HIV ANTIBODY (ROUTINE TESTING W REFLEX): HIV 1&2 Ab, 4th Generation: NONREACTIVE

## 2014-10-24 MED ORDER — LEVOTHYROXINE SODIUM 100 MCG PO TABS: 100.0000 ug | ORAL_TABLET | Freq: Every day | ORAL | Status: DC

## 2014-10-24 NOTE — Addendum Note (Signed)
Addended by: Janalee Dane C on: 10/24/2014 10:00 AM   Modules accepted: Orders

## 2014-12-03 ENCOUNTER — Other Ambulatory Visit (INDEPENDENT_AMBULATORY_CARE_PROVIDER_SITE_OTHER): Payer: BC Managed Care – PPO

## 2014-12-03 DIAGNOSIS — E049 Nontoxic goiter, unspecified: Secondary | ICD-10-CM | POA: Diagnosis not present

## 2014-12-04 LAB — TSH: TSH: 0.76 u[IU]/mL (ref 0.35–4.50)

## 2014-12-06 MED ORDER — LEVOTHYROXINE SODIUM 100 MCG PO TABS: 100.0000 ug | ORAL_TABLET | Freq: Every day | ORAL | Status: DC

## 2014-12-06 NOTE — Addendum Note (Signed)
Addended by: Wilfrid Lund on: 12/06/2014 01:58 PM   Modules accepted: Orders

## 2015-05-02 ENCOUNTER — Other Ambulatory Visit (INDEPENDENT_AMBULATORY_CARE_PROVIDER_SITE_OTHER): Payer: BC Managed Care – PPO

## 2015-05-02 DIAGNOSIS — E049 Nontoxic goiter, unspecified: Secondary | ICD-10-CM

## 2015-05-02 LAB — TSH: TSH: 0.45 u[IU]/mL (ref 0.35–4.50)

## 2015-05-06 ENCOUNTER — Encounter: Payer: Self-pay | Admitting: Internal Medicine

## 2015-05-15 ENCOUNTER — Other Ambulatory Visit: Payer: Self-pay | Admitting: Internal Medicine

## 2015-10-29 ENCOUNTER — Encounter: Payer: Self-pay | Admitting: Internal Medicine

## 2015-10-29 ENCOUNTER — Ambulatory Visit (INDEPENDENT_AMBULATORY_CARE_PROVIDER_SITE_OTHER): Payer: BC Managed Care – PPO | Admitting: Internal Medicine

## 2015-10-29 VITALS — BP 118/74 | HR 67 | Temp 97.7°F | Resp 14 | Ht 67.0 in | Wt 124.4 lb

## 2015-10-29 DIAGNOSIS — Z0000 Encounter for general adult medical examination without abnormal findings: Secondary | ICD-10-CM

## 2015-10-29 DIAGNOSIS — E049 Nontoxic goiter, unspecified: Secondary | ICD-10-CM | POA: Diagnosis not present

## 2015-10-29 DIAGNOSIS — Z09 Encounter for follow-up examination after completed treatment for conditions other than malignant neoplasm: Secondary | ICD-10-CM | POA: Insufficient documentation

## 2015-10-29 DIAGNOSIS — Z0001 Encounter for general adult medical examination with abnormal findings: Secondary | ICD-10-CM

## 2015-10-29 DIAGNOSIS — Z1211 Encounter for screening for malignant neoplasm of colon: Secondary | ICD-10-CM

## 2015-10-29 DIAGNOSIS — R1030 Lower abdominal pain, unspecified: Secondary | ICD-10-CM | POA: Diagnosis not present

## 2015-10-29 LAB — LIPID PANEL
CHOL/HDL RATIO: 3
CHOLESTEROL: 189 mg/dL (ref 0–200)
HDL: 70.7 mg/dL (ref >39.00)
LDL Cholesterol: 104 mg/dL — ABNORMAL HIGH (ref 0–99)
NonHDL: 118.68
TRIGLYCERIDES: 72 mg/dL (ref 0.0–149.0)
VLDL: 14.4 mg/dL (ref 0.0–40.0)

## 2015-10-29 LAB — POCT URINALYSIS DIPSTICK
BILIRUBIN UA: NEGATIVE
GLUCOSE UA: NEGATIVE
KETONES UA: NEGATIVE
LEUKOCYTES UA: NEGATIVE
NITRITE UA: NEGATIVE
Spec Grav, UA: >=1.03
Urobilinogen, UA: 0.2
pH, UA: 5.5

## 2015-10-29 LAB — CBC WITH DIFFERENTIAL/PLATELET
BASOS ABS: 0 10*3/uL (ref 0.0–0.1)
Basophils Relative: 0.2 % (ref 0.0–3.0)
EOS PCT: 4.1 % (ref 0.0–5.0)
Eosinophils Absolute: 0.4 10*3/uL (ref 0.0–0.7)
HCT: 41 % (ref 36.0–46.0)
Hemoglobin: 13.9 g/dL (ref 12.0–15.0)
LYMPHS PCT: 9.8 % — AB (ref 12.0–46.0)
Lymphs Abs: 0.9 10*3/uL (ref 0.7–4.0)
MCHC: 33.9 g/dL (ref 30.0–36.0)
MCV: 91.8 fl (ref 78.0–100.0)
MONOS PCT: 5 % (ref 3.0–12.0)
Monocytes Absolute: 0.4 10*3/uL (ref 0.1–1.0)
NEUTROS ABS: 7 10*3/uL (ref 1.4–7.7)
NEUTROS PCT: 80.9 % — AB (ref 43.0–77.0)
Platelets: 186 10*3/uL (ref 150.0–400.0)
RBC: 4.47 Mil/uL (ref 3.87–5.11)
RDW: 13.1 % (ref 11.5–15.5)
WBC: 8.7 10*3/uL (ref 4.0–10.5)

## 2015-10-29 LAB — URINALYSIS, ROUTINE W REFLEX MICROSCOPIC
Ketones, ur: NEGATIVE
Leukocytes, UA: NEGATIVE
Nitrite: NEGATIVE
PH: 6 (ref 5.0–8.0)
Specific Gravity, Urine: >=1.03 — AB (ref 1.000–1.030)
TOTAL PROTEIN, URINE-UPE24: NEGATIVE
URINE GLUCOSE: NEGATIVE
UROBILINOGEN UA: 0.2 (ref 0.0–1.0)

## 2015-10-29 LAB — COMPREHENSIVE METABOLIC PANEL
ALBUMIN: 4.1 g/dL (ref 3.5–5.2)
ALK PHOS: 64 U/L (ref 39–117)
ALT: 14 U/L (ref 0–35)
AST: 16 U/L (ref 0–37)
BILIRUBIN TOTAL: 0.6 mg/dL (ref 0.2–1.2)
BUN: 13 mg/dL (ref 6–23)
CALCIUM: 9.4 mg/dL (ref 8.4–10.5)
CO2: 31 meq/L (ref 19–32)
CREATININE: 0.93 mg/dL (ref 0.40–1.20)
Chloride: 104 mEq/L (ref 96–112)
GFR: 66.79 mL/min (ref >60.00)
Glucose, Bld: 95 mg/dL (ref 70–99)
Potassium: 5.4 mEq/L — ABNORMAL HIGH (ref 3.5–5.1)
Sodium: 140 mEq/L (ref 135–145)
TOTAL PROTEIN: 7.2 g/dL (ref 6.0–8.3)

## 2015-10-29 LAB — TSH: TSH: 10.34 u[IU]/mL — AB (ref 0.35–4.50)

## 2015-10-29 LAB — T3, FREE: T3, Free: 2.6 pg/mL (ref 2.3–4.2)

## 2015-10-29 LAB — T4, FREE: Free T4: 0.5 ng/dL — ABNORMAL LOW (ref 0.60–1.60)

## 2015-10-29 NOTE — Progress Notes (Signed)
Subjective:    Patient ID: Deborah Yates, female    DOB: 12-21-1960, 55 y.o.   MRN: [id number]  DOS:  10/29/2015 Type of visit - description : cpx Interval history: In general feeling well except for the last few days, see review of systems. Her father was sick in the hospital, spent 3 days with him, diet was different from her normal healthy diet. Since then she has developed mild, ill-defined abdominal pain, mostly in the bilateral lower areas. No fever chills Some urinary frequency but without dysuria or hematuria. BMs are daily without blood or mucus. Overall symptoms are slightly decreased today.   Review of Systems  Constitutional: No fever. No chills. No unexplained wt changes. No unusual sweats  HEENT: No dental problems, no ear discharge, no facial swelling, no voice changes. No eye discharge, no eye  redness , no  intolerance to light   Respiratory: No wheezing , no  difficulty breathing. No cough , no mucus production  Cardiovascular: No CP, no leg swelling , no  Palpitations  GI: no nausea, no vomiting, no diarrhea .  No blood in the stools. No heartburn per se, dysphagia, no odynophagia    Endocrine: No polyphagia, no polyuria , no polydipsia  GU: see above   Musculoskeletal: No joint swellings or unusual aches or pains  Skin: No change in the color of the skin, palor , no  Rash  Allergic, immunologic: No environmental allergies , no  food allergies  Neurological: No dizziness no  syncope. No headaches. No diplopia, no slurred, no slurred speech, no motor deficits, no facial  Numbness  Hematological: No enlarged lymph nodes, no easy bruising , no unusual bleedings  Psychiatry: No suicidal ideas, no hallucinations, no beavior problems, no confusion.  No unusual/severe anxiety, no depression  Past Medical History:  Diagnosis Date  . Goiter    remote, u/s 2007, repeated 05/2007- stable    Past Surgical History:  Procedure Laterality Date  . SKIN CANCER  EXCISION     removed rt arm/rt leg 2008- sees derm 4 times a year    Social History   Social History  . Marital status: Divorced    Spouse name: N/A  . Number of children: 1  . Years of education: N/A   Occupational History  . teacher [employer]    [employer]   Social History Main Topics  . Smoking status: Never Smoker  . Smokeless tobacco: Never Used  . Alcohol use Yes     Comment: socially  . Drug use: Unknown  . Sexual activity: Not on file   Other Topics Concern  . Not on file   Social History Narrative   Very healthy life style     Family History  Problem Relation Age of Onset  . Colon cancer Brother     ~77 y/o  . Breast cancer Maternal Grandmother 30  . Coronary artery disease Neg Hx   . Hypertension Neg Hx   . Stroke Neg Hx   . Diabetes Neg Hx        Medication List       Accurate as of 10/29/15  8:15 AM. Always use your most recent med list.          levothyroxine 100 MCG tablet Commonly known as:  SYNTHROID, LEVOTHROID Take 1 tablet (100 mcg total) by mouth daily before breakfast.          Objective:   Physical Exam BP 118/74 (BP Location: Left Arm, Patient  Position: Sitting, Cuff Size: Small)   Pulse 67   Temp 97.7 F (36.5 C) (Oral)   Resp 14   Ht 5\' 7"  (1.702 m)   Wt 124 lb 6 oz (56.4 kg)   LMP 04/13/2012   SpO2 97%   BMI 19.48 kg/m   General:   Well developed, well nourished . NAD.  Neck: Palpable thyroid,  Larger  on the right, not tender. Neck otherwise with no LAD or mass HEENT:  Normocephalic . Face symmetric, atraumatic Lungs:  CTA B Normal respiratory effort, no intercostal retractions, no accessory muscle use. Heart: RRR,  no murmur.  No pretibial edema bilaterally  Abdomen:  Not distended, soft, Mild tenderness without rebound or rigidity mostly in the lower abdomen bilaterally and some at the left side of the abdomen   Skin: Exposed areas without rash. Not pale. Not jaundice Neurologic:  alert & oriented  X3.  Speech normal, gait appropriate for age and unassisted Strength symmetric and appropriate for age.  Psych: Cognition and judgment appear intact.  Cooperative with normal attention span and concentration.  Behavior appropriate. No anxious or depressed appearing.    Assessment & Plan:     Assessment  GOITER, dx remotely-->  u/s 2007,  and multiple other, saw endo 2014: rec to recheck a [place] ~2016 Menopausal (LMP 2016) G1 P1 Sees dermatology regularly     PLAN Goiter: Since last year, [place] was not done. Will check a TSH-TFTs and a [place] Lower abdominal pain: With no fever chills, nausea or diarrhea. Exam showed the tender lower abdomen without worrisome findings. UA: + Protein and blood. Will check a UA and urine culture, antibiotics if appropriate. To call if the improvement does not continue gradually RTC one year

## 2015-10-29 NOTE — Progress Notes (Signed)
Pre visit review using our clinic review tool, if applicable. No additional management support is needed unless otherwise documented below in the visit note. 

## 2015-10-29 NOTE — Assessment & Plan Note (Signed)
Goiter: Since last year, [place] was not done. Will check a TSH-TFTs and a [place] Lower abdominal pain: With no fever chills, nausea or diarrhea. Exam showed the tender lower abdomen without worrisome findings. UA: + Protein and blood. Will check a UA and urine culture, antibiotics if appropriate. To call if the improvement does not continue gradually RTC one year

## 2015-10-29 NOTE — Patient Instructions (Signed)
GO TO THE LAB : Get the blood work     GO TO THE FRONT DESK Schedule your next appointment for a  physical exam in one year  Will schedule a ultrasound of the thyroid and a referral to the GI doctors for a colonoscopy

## 2015-10-29 NOTE — Assessment & Plan Note (Signed)
TD 2009, flu shot at work Female care: sees gyn for PAPs, MMGs, last visit 09-2015 Never had a dexa: Will check in few years, she just started her menopause CCS: cscope 06-2007, neg, due for repeat cscope by 2014, has not done that so far, stress the importance of proceed given family history. Refer to GI again. Labs Has a very healthy lifestyle.

## 2015-10-30 LAB — URINE CULTURE: ORGANISM ID, BACTERIA: NO GROWTH

## 2015-11-01 ENCOUNTER — Encounter: Payer: Self-pay | Admitting: Internal Medicine

## 2015-11-01 DIAGNOSIS — N289 Disorder of kidney and ureter, unspecified: Secondary | ICD-10-CM

## 2015-11-01 MED ORDER — LEVOTHYROXINE SODIUM 125 MCG PO TABS: 125.0000 ug | ORAL_TABLET | Freq: Every day | ORAL | 1 refills | Status: DC

## 2015-11-01 NOTE — Addendum Note (Signed)
Addended byDamita Dunnings D on: 11/01/2015 01:48 PM   Modules accepted: Orders

## 2015-11-04 NOTE — Telephone Encounter (Signed)
BMP ordered

## 2015-11-15 ENCOUNTER — Other Ambulatory Visit: Payer: BC Managed Care – PPO

## 2015-11-25 ENCOUNTER — Ambulatory Visit
Admission: RE | Admit: 2015-11-25 | Discharge: 2015-11-25 | Disposition: A | Discharge status: Home / Self Care | Payer: BC Managed Care – PPO | Source: Ambulatory Visit | Attending: Internal Medicine | Admitting: Internal Medicine

## 2015-11-27 NOTE — Addendum Note (Signed)
Addended by: Damita Dunnings D on: 11/27/2015 10:41 AM   Modules accepted: Orders

## 2015-11-28 ENCOUNTER — Encounter: Payer: Self-pay | Admitting: Internal Medicine

## 2015-12-02 ENCOUNTER — Encounter: Payer: Self-pay | Admitting: Internal Medicine

## 2015-12-02 DIAGNOSIS — Z800 Family history of malignant neoplasm of digestive organs: Secondary | ICD-10-CM

## 2015-12-02 NOTE — Telephone Encounter (Signed)
Last cscope in 09, recommended 5 year follow-up. GI referral placed. Pt informed via MyChart.

## 2016-01-08 ENCOUNTER — Encounter: Payer: Self-pay | Admitting: Internal Medicine

## 2016-01-08 ENCOUNTER — Ambulatory Visit (INDEPENDENT_AMBULATORY_CARE_PROVIDER_SITE_OTHER): Payer: BC Managed Care – PPO | Admitting: Internal Medicine

## 2016-01-08 VITALS — BP 117/70 | HR 74 | Wt 129.0 lb

## 2016-01-08 DIAGNOSIS — E042 Nontoxic multinodular goiter: Secondary | ICD-10-CM

## 2016-01-08 DIAGNOSIS — E039 Hypothyroidism, unspecified: Secondary | ICD-10-CM | POA: Diagnosis not present

## 2016-01-08 LAB — TSH: TSH: 0.94 u[IU]/mL (ref 0.35–4.50)

## 2016-01-08 LAB — T4, FREE: Free T4: 1.02 ng/dL (ref 0.60–1.60)

## 2016-01-08 NOTE — Progress Notes (Addendum)
Patient ID: Deborah Yates, female   DOB: 12-06-1960, 55 y.o.   MRN: [id number]    HPI  Deborah Yates is a 55 y.o.-year-old female, referred by her PCP, Dr. Larose Kells, for evaluation for multiple thyroid nodules and hypothyroidism. I have seen the patient before, more than 3 years ago, for the same problem.  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 Started taking Synthroid 54 mcg in mid-20s to suppress the growth of the nodules. She continues on Synthroid.  She was having regular thyroid ultrasound in the past. Thyroid ultrasound (04/14/2012):   R mid pole nodule 2.2 x 1.6 x 1.2 cm, previously 15 x 9 x 8 mm.   Multiple isthmic nodules, largest 2.1 cm in the right side of the isthmus, previously 9 x 7 x 4 mm (per radiologist, this nodule did not change much in size, upon review of the new and old images)  L superior pole complex nodule 3.5 x 2.6 x 2.6 cm, previously 4.1 x 2.8 x 2.6 cm  L inferior complex pole nodule 3.5 x 3.2 x 2.2 cm, previously 2.9 x 2.5 x 1.8 cm   Lymphadenopathy: None visualized.  Thyroid ultrasound (11/25/2015): IMPRESSION: 1. Thyromegaly with bilateral nodules. 2. Recommend FNA biopsy of 4 cm left mid nodule. 3. Recommend 1 year follow-up ultrasound of 2.4 cm right mid nodule.    Pt denies: - feeling nodules in neck - hoarseness - dysphagia - choking - SOB with lying down  Pt is on LT4 100 mcg (did not increase to 125 mcg as advised by PCP after last TSH returned high): - fasting - with water - separated by > 30 min from b'fast - no Ca, Fe, PPI, MVI  I reviewed pt's thyroid tests - last TSH after missing LT4 doses: Lab Results  Component Value Date   TSH 10.34 (H) 10/29/2015   TSH 0.45 05/02/2015   TSH 0.76 12/03/2014   TSH 10.58 (H) 10/23/2014   TSH 0.07 (L) 06/16/2012   TSH 6.97 (H) 04/11/2012   TSH 0.57 08/26/2011   TSH 0.44 12/29/2010   TSH 0.14 (L) 08/21/2010   TSH 0.10 (L) 05/06/2010   FREET4 0.50 (L) 10/29/2015     FREET4 0.69 10/23/2014   FREET4 1.27 06/16/2012   FREET4 0.76 04/11/2012    Pt denies: - fatigue - heat intolerance/cold intolerance - tremors - palpitations - anxiety/depression - hyperdefecation/constipation - weight loss/weight gain - dry skin - hair loss  No FH of thyroid ds. No FH of thyroid cancer. No h/o radiation tx to head or neck.  No seaweed or kelp. No recent contrast studies. No steroid use. No herbal supplements. No Biotin supplements or Hair, Skin and Nails vitamins.  ROS: Constitutional: no weight gain/loss, no fatigue, no subjective hyperthermia/hypothermia Eyes: no blurry vision, no xerophthalmia ENT: no sore throat, no nodules palpated in throat, no dysphagia/odynophagia, no hoarseness Cardiovascular: no CP/SOB/palpitations/leg swelling Respiratory: no cough/SOB Gastrointestinal: no N/V/D/C Musculoskeletal: no muscle/joint aches Skin: no rashes Neurological: no tremors/numbness/tingling/dizziness Psychiatric: no depression/anxiety  Past Medical History:  Diagnosis Date  . Goiter    remote, u/s 2007, repeated 05/2007- stable   Past Surgical History:  Procedure Laterality Date  . SKIN CANCER EXCISION     removed rt arm/rt leg 2008- sees derm 4 times a year   Social History   Social History  . Marital status: Divorced    Spouse name: N/A  . Number of children: 1   Occupational History  .  teacher [employer]    [employer]   Social History Main Topics  . Smoking status: Never Smoker  . Smokeless tobacco: Never Used  . Alcohol use Yes     Comment: socially, Once a month   . Drug use: no   Social History Narrative   Very healthy life style   Current Outpatient Prescriptions on File Prior to Visit  Medication Sig Dispense Refill  . levothyroxine (SYNTHROID, LEVOTHROID) 125 MCG tablet Take 1 tablet (125 mcg total) by mouth daily before breakfast. 30 tablet 1   No current facility-administered medications on file prior to visit.    No  Known Allergies Family History  Problem Relation Age of Onset  . Colon cancer Brother     ~69 y/o  . Breast cancer Maternal Grandmother 67  . Coronary artery disease Neg Hx   . Hypertension Neg Hx   . Stroke Neg Hx   . Diabetes Neg Hx     PE: BP 117/70   Pulse 74   Wt 129 lb (58.5 kg)   LMP 04/13/2012   SpO2 99%   BMI 20.20 kg/m  Wt Readings from Last 3 Encounters:  01/08/16 129 lb (58.5 kg)  10/29/15 124 lb 6 oz (56.4 kg)  10/23/14 120 lb 4 oz (54.5 kg)    Constitutional: overweight, in NAD Eyes: PERRLA, EOMI, no exophthalmos ENT: moist mucous membranes, no thyromegaly, no cervical lymphadenopathy Cardiovascular: RRR, No MRG Respiratory: CTA B Gastrointestinal: abdomen soft, NT, ND, BS+ Musculoskeletal: no deformities, strength intact in all 4;  Skin: moist, warm, no rashes Neurological: no tremor with outstretched hands, DTR normal in all 4  ASSESSMENT: 1. Multiple thyroid nodules  2. Hypothyroidism  PLAN: 1. Multiple thyroid nodules - I reviewed the images of her thyroid ultrasound along with the patient. I pointed out that the dominant nodules are large, this being a risk factor for cancer. However, these nodules do not harbor concerning features except increased internal blood flow in the left, 4 cm, thyroid nodule Otherwise, the dominant nodules are: - not hypoechoic (they are partially cystic) - without microcalcifications - more wide than tall - well delimited from surrounding tissue Pt does not have a thyroid cancer family history or a personal history of RxTx to head/neck. All these would favor benignity.  - The radiologist recommended a thyroid biopsy (FNA) of the 4 cm nodule, and I agree with this. I explained what the test entails.  - We discussed about other options, to wait for another 6 months to a year and see if the nodule grows, and only intervene at that time.   - patient decided to have the FNA done now >> I ordered this.  - I explained that  this is not cancer, we can continue to follow her on a yearly basis, and check another ultrasound in another year or 2. - I'll see her back in a year, assuming her FNA is normal. If FNA abnormal, we will meet sooner.  - I advised pt to join my chart and I will send her the results through there   2. Hypothyroidism - Latest TSH was high, but this was because she skipped doses while out of town - She is now on levothyroxine 100 g daily >> will continue - We discussed about correct intake of levothyroxine: Every day, fasting, with water, separated by at least 4 hours from PPIs, calcium, iron, multivitamins. - We'll repeat her TFTs today.  Office Visit on 01/08/2016  Component Date Value Ref  Range Status  . Free T4 01/08/2016 1.02  0.60 - 1.60 ng/dL Final   Comment: Specimens from patients who are undergoing biotin therapy and /or ingesting biotin supplements may contain high levels of biotin.  The higher biotin concentration in these specimens interferes with this Free T4 assay.  Specimens that contain high levels  of biotin may cause false high results for this Free T4 assay.  Please interpret results in light of the total clinical presentation of the patient.    Marland Kitchen TSH 01/08/2016 0.94  0.35 - 4.50 uIU/mL Final   TFTs are now normal.  Adequacy Reason Satisfactory For Evaluation. Diagnosis THYROID, FINE NEEDLE ASPIRATION LEFT, LMP, (SPECIMEN 1OF 1, COLLECTED ON 01/14/16 CONSISTENT WITH BENIGN FOLLICULAR NODULE ([name] MD Pathologist, Electronic Signature (Case signed 01/15/2016) Specimen Clinical Information Left Mid, 4 x 1.6 x 2cm, mixed cystic and solid isoechoic, TI-RADS points 3 Source Thyroid, Fine Needle Aspiration, Left, LMP, (Specimen 1 of 1, collected on 01/14/16)  Philemon Kingdom, MD PhD [facility] Endocrinology

## 2016-01-08 NOTE — Patient Instructions (Addendum)
Please stop at the lab.  Continue Levothyroxine 100 mcg daily.  Take the thyroid hormone every day, with water, at least 30 minutes before breakfast, separated by at least 4 hours from: - acid reflux medications - calcium - iron - multivitamins   Thyroid Biopsy The thyroid gland is a butterfly-shaped gland located in the front of the neck. It produces hormones that affect metabolism, growth and development, and body temperature. Thyroid biopsy is a procedure in which small samples of tissue or fluid are removed from the thyroid gland. The samples are then looked at under a microscope to check for abnormalities. This procedure is done to determine the cause of thyroid problems. It may be done to check for infection, cancer, or other thyroid problems. Two methods may be used for a thyroid biopsy. In one method, a thin needle is inserted through the skin and into the thyroid gland. In the other method, an open incision is made through the skin. Tell a health care provider about:  Any allergies you have.  All medicines you are taking, including vitamins, herbs, eye drops, creams, and over-the-counter medicines.  Any problems you or family members have had with anesthetic medicines.  Any blood disorders you have.  Any surgeries you have had.  Any medical conditions you have. What are the risks? Generally, this is a safe procedure. However, problems can occur and include:  Bleeding from the procedure site.  Infection.  Injury to structures near the thyroid gland. What happens before the procedure?  Ask your health care provider about:  Changing or stopping your regular medicines. This is especially important if you are taking diabetes medicines or blood thinners.  Taking medicines such as aspirin and ibuprofen. These medicines can thin your blood. Do not take these medicines before your procedure if your health care provider asks you not to.  Do not eat or drink anything after  midnight on the night before the procedure or as directed by your health care provider.  You may have a blood sample taken. What happens during the procedure? Either of these methods may be used to perform a thyroid biopsy:  Fine needle biopsy. You may be given medicine to help you relax (sedative). You will be asked to lie on your back with your head tipped backward to extend your neck. An area on your neck will be cleaned. A needle will then be inserted through the skin of your neck. You may be asked to avoid coughing, talking, swallowing, or making sounds during some portions of the procedure. The needle will be withdrawn once the tissue or fluid samples have been removed. Pressure may be applied to your neck to reduce swelling and ensure that bleeding has stopped. The samples will be sent to a lab for examination.  Open biopsy. You will be given medicine to make you sleep (general anesthetic). An incision will be made in your neck. A sample of thyroid tissue will be removed using surgical tools. The tissue sample will be sent for examination. In some cases, the sample may be examined during the biopsy. If that is done and cancer cells are found, some or all of the thyroid gland may be removed. The incision will be closed with stitches. What happens after the procedure?  Your recovery will be assessed and monitored.  You may have soreness and tenderness at the site of the biopsy. This should go away after a few days.  If you had an open biopsy, you may have a hoarse  voice or sore throat for a couple days.  It is your responsibility to get your test results. This information is not intended to replace advice given to you by your health care provider. Make sure you discuss any questions you have with your health care provider. Document Released: 11/23/2006 Document Revised: 09/29/2015 Document Reviewed: 04/20/2013 Elsevier Interactive Patient Education  2017 [employer].  Thyroid Biopsy,  Care After Refer to this sheet in the next few weeks. These instructions provide you with information on caring for yourself after your procedure. Your health care provider may also give you more specific instructions. Your treatment has been planned according to current medical practices, but problems sometimes occur. Call your health care provider if you have any problems or questions after your procedure. What can I expect after the procedure? After your procedure, it is typical to have the following:  You may have soreness and tenderness at the biopsy site for a few days.  You may have a sore throat or a hoarse voice if you had an open biopsy. This should go away after a couple days. Follow these instructions at home:  Take medicines only as directed by your health care provider.  To ease discomfort at the biopsy site:  Keep your head raised on a pillow when you are lying down.  Support the back of your head and neck with both hands as you sit up from a lying position.  If you have a sore throat, try using throat lozenges or gargling with warm salt water.  Keep all follow-up visits as directed by your health care provider. This is important. Contact a health care provider if:  You have a fever. Get help right away if:  You have severe bleeding from the biopsy site.  You have difficulty swallowing.  You have drainage, redness, swelling, or pain at the biopsy site.  You have swollen glands (lymph nodes) in your neck. This information is not intended to replace advice given to you by your health care provider. Make sure you discuss any questions you have with your health care provider. Document Released: 08/23/2013 Document Revised: 09/29/2015 Document Reviewed: 04/20/2013 Elsevier Interactive Patient Education  2017 [employer].

## 2016-01-14 ENCOUNTER — Ambulatory Visit
Admission: RE | Admit: 2016-01-14 | Discharge: 2016-01-14 | Disposition: A | Discharge status: Home / Self Care | Payer: BC Managed Care – PPO | Source: Ambulatory Visit | Attending: Internal Medicine | Admitting: Internal Medicine

## 2016-01-14 ENCOUNTER — Other Ambulatory Visit (HOSPITAL_COMMUNITY)
Admission: RE | Admit: 2016-01-14 | Discharge: 2016-01-14 | Disposition: A | Discharge status: Home / Self Care | Payer: BC Managed Care – PPO | Source: Ambulatory Visit | Attending: Radiology | Admitting: Radiology

## 2016-01-14 DIAGNOSIS — E041 Nontoxic single thyroid nodule: Secondary | ICD-10-CM | POA: Insufficient documentation

## 2016-01-20 ENCOUNTER — Telehealth: Payer: Self-pay

## 2016-01-20 NOTE — Telephone Encounter (Signed)
-----   Message from Colon Branch, MD sent at 01/18/2016 11:20 AM EST ----- Regarding: ua Please send a mychart message, she is due for a UA, urine culture DX microhematuria. At her convenience, no needs for office visit

## 2016-01-20 NOTE — Telephone Encounter (Signed)
My Chart message sent

## 2016-01-29 ENCOUNTER — Ambulatory Visit (AMBULATORY_SURGERY_CENTER): Payer: Self-pay | Admitting: *Deleted

## 2016-01-29 VITALS — Ht 66.5 in | Wt 130.0 lb

## 2016-01-29 DIAGNOSIS — Z800 Family history of malignant neoplasm of digestive organs: Secondary | ICD-10-CM

## 2016-01-29 NOTE — Progress Notes (Signed)
Patient denies any allergies to eggs or soy. Patient denies any problems with sedation. Patient denies any oxygen use at home and does not take any diet/weight loss medications. Patient declined EMMI education.

## 2016-02-19 ENCOUNTER — Ambulatory Visit (AMBULATORY_SURGERY_CENTER): Payer: BC Managed Care – PPO | Admitting: Internal Medicine

## 2016-02-19 ENCOUNTER — Encounter: Payer: Self-pay | Admitting: Internal Medicine

## 2016-02-19 VITALS — BP 98/65 | HR 59 | Temp 98.0°F | Resp 12 | Ht 67.0 in | Wt 129.0 lb

## 2016-02-19 DIAGNOSIS — Z800 Family history of malignant neoplasm of digestive organs: Secondary | ICD-10-CM

## 2016-02-19 DIAGNOSIS — Z1212 Encounter for screening for malignant neoplasm of rectum: Secondary | ICD-10-CM | POA: Diagnosis not present

## 2016-02-19 DIAGNOSIS — K635 Polyp of colon: Secondary | ICD-10-CM | POA: Diagnosis not present

## 2016-02-19 DIAGNOSIS — D125 Benign neoplasm of sigmoid colon: Secondary | ICD-10-CM

## 2016-02-19 DIAGNOSIS — Z1211 Encounter for screening for malignant neoplasm of colon: Secondary | ICD-10-CM

## 2016-02-19 MED ORDER — SODIUM CHLORIDE 0.9 % IV SOLN: 500.0000 mL | INTRAVENOUS | Status: DC

## 2016-02-19 NOTE — Patient Instructions (Addendum)
I found and removed one small polyp that looks benign. Your next routine colonoscopy should be in 5 years - 2023.  You also have a condition called diverticulosis - common and not usually a problem. Please read the handout provided.  I appreciate the opportunity to care for you. Gatha Mayer, MD, [facility]  Discharge instructions given. Handout on polyps. Resume previous medications. YOU HAD AN ENDOSCOPIC PROCEDURE TODAY AT [facility]:   Refer to the procedure report that was given to you for any specific questions about what was found during the examination.  If the procedure report does not answer your questions, please call your gastroenterologist to clarify.  If you requested that your care partner not be given the details of your procedure findings, then the procedure report has been included in a sealed envelope for you to review at your convenience later.  YOU SHOULD EXPECT: Some feelings of bloating in the abdomen. Passage of more gas than usual.  Walking can help get rid of the air that was put into your GI tract during the procedure and reduce the bloating. If you had a lower endoscopy (such as a colonoscopy or flexible sigmoidoscopy) you may notice spotting of blood in your stool or on the toilet paper. If you underwent a bowel prep for your procedure, you may not have a normal bowel movement for a few days.  Please Note:  You might notice some irritation and congestion in your nose or some drainage.  This is from the oxygen used during your procedure.  There is no need for concern and it should clear up in a day or so.  SYMPTOMS TO REPORT IMMEDIATELY:   Following lower endoscopy (colonoscopy or flexible sigmoidoscopy):  Excessive amounts of blood in the stool  Significant tenderness or worsening of abdominal pains  Swelling of the abdomen that is new, acute  Fever of 100F or higher   For urgent or emergent issues, a gastroenterologist can be reached at  any hour by calling [phone].   DIET:  We do recommend a small meal at first, but then you may proceed to your regular diet.  Drink plenty of fluids but you should avoid alcoholic beverages for 24 hours.  ACTIVITY:  You should plan to take it easy for the rest of today and you should NOT DRIVE or use heavy machinery until tomorrow (because of the sedation medicines used during the test).    FOLLOW UP: Our staff will call the number listed on your records the next business day following your procedure to check on you and address any questions or concerns that you may have regarding the information given to you following your procedure. If we do not reach you, we will leave a message.  However, if you are feeling well and you are not experiencing any problems, there is no need to return our call.  We will assume that you have returned to your regular daily activities without incident.  If any biopsies were taken you will be contacted by phone or by letter within the next 1-3 weeks.  Please call us at [phone] if you have not heard about the biopsies in 3 weeks.    SIGNATURES/CONFIDENTIALITY: You and/or your care partner have signed paperwork which will be entered into your electronic medical record.  These signatures attest to the fact that that the information above on your After Visit Summary has been reviewed and is understood.  Full responsibility of the confidentiality  of this discharge information lies with you and/or your care-partner.

## 2016-02-19 NOTE — Progress Notes (Signed)
Called to room to assist during endoscopic procedure.  Patient ID and intended procedure confirmed with present staff. Received instructions for my participation in the procedure from the performing physician.  

## 2016-02-19 NOTE — Progress Notes (Signed)
Report to PACU, RN, vss, BBS= Clear.  

## 2016-02-19 NOTE — Op Note (Signed)
[place] Patient Name: Deborah Yates Procedure Date: 02/19/2016 9:07 AM MRN: [id number] Endoscopist: Gatha Mayer , MD Age: 56 Referring MD:  Date of Birth: July 26, 1960 Gender: Female Account #: [id number] Procedure:                Colonoscopy Indications:              Screening in patient at increased risk: Colorectal                            cancer in brother before age 7 Medicines:                Propofol per Anesthesia, Monitored Anesthesia Care Procedure:                Pre-Anesthesia Assessment:                           - Prior to the procedure, a History and Physical                            was performed, and patient medications and                            allergies were reviewed. The patient's tolerance of                            previous anesthesia was also reviewed. The risks                            and benefits of the procedure and the sedation                            options and risks were discussed with the patient.                            All questions were answered, and informed consent                            was obtained. Prior Anticoagulants: The patient has                            taken no previous anticoagulant or antiplatelet                            agents. ASA Grade Assessment: II - A patient with                            mild systemic disease. After reviewing the risks                            and benefits, the patient was deemed in                            satisfactory condition to undergo the procedure.  After obtaining informed consent, the colonoscope                            was passed under direct vision. Throughout the                            procedure, the patient's blood pressure, pulse, and                            oxygen saturations were monitored continuously. The                            Model PCF-H190DL [id number]) scope was introduced    through the anus and advanced to the the cecum,                            identified by appendiceal orifice and ileocecal                            valve. The colonoscopy was performed without                            difficulty. The ileocecal valve, appendiceal                            orifice, and rectum were photographed. The quality                            of the bowel preparation was good. The bowel                            preparation used was Miralax. Scope In: 9:16:08 AM Scope Out: 9:32:02 AM Scope Withdrawal Time: 0 hours 11 minutes 24 seconds  Total Procedure Duration: 0 hours 15 minutes 54 seconds  Findings:                 The perianal and digital rectal examinations were                            normal.                           A 5 mm polyp was found in the sigmoid colon. The                            polyp was semi-pedunculated. The polyp was removed                            with a cold snare. Resection and retrieval were                            complete. Verification of patient identification                            for the specimen was done.  Estimated blood loss was                            minimal.                           The exam was otherwise without abnormality on                            direct and retroflexion views. Complications:            No immediate complications. Estimated Blood Loss:     Estimated blood loss was minimal. Impression:               - One 5 mm polyp in the sigmoid colon, removed with                            a cold snare. Resected and retrieved.                           - The examination was otherwise normal on direct                            and retroflexion views. Recommendation:           - Patient has a contact number available for                            emergencies. The signs and symptoms of potential                            delayed complications were discussed with the                             patient. Return to normal activities tomorrow.                            Written discharge instructions were provided to the                            patient.                           - Resume previous diet.                           - Continue present medications.                           - Repeat colonoscopy in 5 years for surveillance. Gatha Mayer, MD 02/19/2016 9:44:43 AM This report has been signed electronically.

## 2016-02-20 ENCOUNTER — Telehealth: Payer: Self-pay

## 2016-02-20 NOTE — Telephone Encounter (Signed)
  Follow up Call-  Call back number 02/19/2016  Post procedure Call Back phone  # [phone]  Permission to leave phone message Yes  Some recent data might be hidden     Patient questions:  Do you have a fever, pain , or abdominal swelling? No. Pain Score  0 *  Have you tolerated food without any problems? Yes.    Have you been able to return to your normal activities? Yes.    Do you have any questions about your discharge instructions: Diet   No. Medications  No. Follow up visit  No.  Do you have questions or concerns about your Care? No.  Actions: * If pain score is 4 or above: No action needed, pain <4.

## 2016-02-24 NOTE — Progress Notes (Signed)
Polyp not precancerous Recall 5 yrs (FHx CRCA) 2023 My Chart note No letter

## 2016-04-07 ENCOUNTER — Other Ambulatory Visit: Payer: Self-pay | Admitting: Internal Medicine

## 2016-04-08 ENCOUNTER — Encounter: Payer: Self-pay | Admitting: Internal Medicine

## 2016-04-24 ENCOUNTER — Encounter: Payer: Self-pay | Admitting: Internal Medicine

## 2016-04-27 ENCOUNTER — Other Ambulatory Visit: Payer: Self-pay

## 2016-04-27 DIAGNOSIS — E039 Hypothyroidism, unspecified: Secondary | ICD-10-CM

## 2016-04-27 MED ORDER — LEVOTHYROXINE SODIUM 100 MCG PO TABS: 100.0000 ug | ORAL_TABLET | Freq: Every day | ORAL | 1 refills | Status: DC

## 2016-04-27 MED ORDER — LEVOTHYROXINE SODIUM 100 MCG PO TABS: 100.0000 ug | ORAL_TABLET | Freq: Every day | ORAL | 3 refills | Status: DC

## 2017-01-07 ENCOUNTER — Encounter: Payer: Self-pay | Admitting: Internal Medicine

## 2017-01-07 ENCOUNTER — Ambulatory Visit: Payer: BC Managed Care – PPO | Admitting: Internal Medicine

## 2017-01-07 VITALS — BP 100/60 | HR 73 | Wt 137.6 lb

## 2017-01-07 DIAGNOSIS — Z23 Encounter for immunization: Secondary | ICD-10-CM | POA: Diagnosis not present

## 2017-01-07 DIAGNOSIS — E042 Nontoxic multinodular goiter: Secondary | ICD-10-CM

## 2017-01-07 DIAGNOSIS — E039 Hypothyroidism, unspecified: Secondary | ICD-10-CM

## 2017-01-07 MED ORDER — LEVOTHYROXINE SODIUM 100 MCG PO TABS: 100.0000 ug | ORAL_TABLET | Freq: Every day | ORAL | 3 refills | Status: DC

## 2017-01-07 NOTE — Patient Instructions (Signed)
Please come back for labs in 5 weeks  Continue Levothyroxine 100 mcg daily.  Take the thyroid hormone every day, with water, at least 30 minutes before breakfast, separated by at least 4 hours from: - acid reflux medications - calcium - iron - multivitamins  Please come back for a follow-up appointment in 1 year.

## 2017-01-07 NOTE — Progress Notes (Signed)
Patient ID: Deborah Yates, female   DOB: 1961-01-06, 56 y.o.   MRN: [id number]    HPI  Deborah Yates is a 56 y.o.-year-old female, returning for follow-up for multiple thyroid nodules and hypothyroidism.  Last visit a year ago.  Reviewed and attended history: She was dx with MNG in her 65s. Started taking Synthroid 100 mcg in mid-20s to suppress the growth of the nodules. She continues on Synthroid.  She was having regular thyroid ultrasound in the past.  Thyroid ultrasound (04/14/2012):   R mid pole nodule 2.2 x 1.6 x 1.2 cm, previously 15 x 9 x 8 mm.   Multiple isthmic nodules, largest 2.1 cm in the right side of the isthmus, previously 9 x 7 x 4 mm (per radiologist, this nodule did not change much in size, upon review of the new and old images)  L superior pole complex nodule 3.5 x 2.6 x 2.6 cm, previously 4.1 x 2.8 x 2.6 cm  L inferior complex pole nodule 3.5 x 3.2 x 2.2 cm, previously 2.9 x 2.5 x 1.8 cm   Lymphadenopathy: None visualized.    Thyroid ultrasound (11/25/2015): IMPRESSION: 1. Thyromegaly with bilateral nodules. 2. Recommend FNA biopsy of 4 cm left mid nodule. 3. Recommend 1 year follow-up ultrasound of 2.4 cm right mid nodule.    Adequacy Reason Satisfactory For Evaluation. Diagnosis THYROID, FINE NEEDLE ASPIRATION LEFT, LMP, (SPECIMEN 1OF 1, COLLECTED ON 01/14/16 CONSISTENT WITH benign follicular nodule (BETHESDA CATEGORY II). Deborah Cutter MD Pathologist, Electronic Signature (Case signed 01/15/2016) Specimen Clinical Information Left Mid, 4 x 1.6 x 2cm, mixed cystic and solid isoechoic, TI-RADS points 3 Source Thyroid, Fine Needle Aspiration, Left, LMP, (Specimen 1 of 1, collected on 01/14/16)  Pt is on levothyroxine 100 mcg daily, taken: - missed 8 days since last week >> ran out - in am - fasting - at least 30 min from b'fast - no Ca, Fe, MVI, PPIs - not on Biotin  I reviewed pt's thyroid tests:  Lab Results  Component Value Date   TSH  0.94 01/08/2016   TSH 10.34 (H) 10/29/2015   TSH 0.45 05/02/2015   TSH 0.76 12/03/2014   TSH 10.58 (H) 10/23/2014   TSH 0.07 (L) 06/16/2012   TSH 6.97 (H) 04/11/2012   TSH 0.57 08/26/2011   TSH 0.44 12/29/2010   TSH 0.14 (L) 08/21/2010   FREET4 1.02 01/08/2016   FREET4 0.50 (L) 10/29/2015   FREET4 0.69 10/23/2014   FREET4 1.27 06/16/2012   FREET4 0.76 04/11/2012    Pt denies: - feeling nodules in neck - hoarseness - dysphagia - choking - SOB with lying down  No FH of thyroid ds. No FH of thyroid cancer. No h/o radiation tx to head or neck.  No seaweed or kelp. No recent contrast studies. No herbal supplements. No Biotin use. No recent steroids use.   ROS: Constitutional: no weight gain/no weight loss, no fatigue, no subjective hyperthermia, no subjective hypothermia Eyes: no blurry vision, no xerophthalmia ENT: no sore throat, + see HPI Cardiovascular: no CP/no SOB/no palpitations/no leg swelling Respiratory: no cough/no SOB/no wheezing Gastrointestinal: no N/no V/no D/no C/no acid reflux Musculoskeletal: no muscle aches/no joint aches Skin: no rashes, no hair loss Neurological: no tremors/no numbness/no tingling/no dizziness  I reviewed pt's medications, allergies, PMH, social hx, family hx, and changes were documented in the history of present illness. Otherwise, unchanged from my initial visit note.  Past Medical History:  Diagnosis Date  . Goiter    remote,  u/s 2007, repeated 05/2007- stable   Past Surgical History:  Procedure Laterality Date  . SKIN CANCER EXCISION     removed rt arm/rt leg 2008- sees derm 4 times a year   Social History   Social History  . Marital status: Divorced    Spouse name: N/A  . Number of children: 1   Occupational History  . teacher [employer]    [employer]   Social History Main Topics  . Smoking status: Never Smoker  . Smokeless tobacco: Never Used  . Alcohol use Yes     Comment: socially, Once a month   . Drug use:  no   Social History Narrative   Very healthy life style   Current Outpatient Medications on File Prior to Visit  Medication Sig Dispense Refill  . levothyroxine (SYNTHROID, LEVOTHROID) 100 MCG tablet Take 1 tablet (100 mcg total) by mouth daily. 90 tablet 3   Current Facility-Administered Medications on File Prior to Visit  Medication Dose Route Frequency Provider Last Rate Last Dose  . 0.9 %  sodium chloride infusion  500 mL Intravenous Continuous Gatha Mayer, MD       No Known Allergies Family History  Problem Relation Age of Onset  . Colon cancer Brother 34  . Breast cancer Maternal Grandmother 43  . Coronary artery disease Neg Hx   . Hypertension Neg Hx   . Stroke Neg Hx   . Diabetes Neg Hx    PE: BP 100/60   Wt 137 lb 9.6 oz (62.4 kg)   LMP 04/13/2012   BMI 21.55 kg/m  Wt Readings from Last 3 Encounters:  01/07/17 137 lb 9.6 oz (62.4 kg)  02/19/16 129 lb (58.5 kg)  01/29/16 130 lb (59 kg)   Constitutional: Normal weight, in NAD Eyes: PERRLA, EOMI, no exophthalmos ENT: moist mucous membranes, no thyromegaly, no cervical lymphadenopathy Cardiovascular: RRR, No MRG Respiratory: CTA B Gastrointestinal: abdomen soft, NT, ND, BS+ Musculoskeletal: no deformities, strength intact in all 4 Skin: moist, warm, no rashes Neurological: no tremor with outstretched hands, DTR normal in all 4  ASSESSMENT: 1. Multiple thyroid nodules  2. Hypothyroidism  PLAN: 1. Multiple thyroid nodules - I reviewed the report of her thyroid ultrasound along with the patient. Her thyroid nodules are low risk, and we Bx'ed the nodule that increased in size on last U/S (L nodule) >> benign. Pt does not have a thyroid cancer family history or a personal history of RxTx to head/neck. All these would favor benignity.  - she denies neck compression sxs >> will follow the nodules clinically and repeat a thyroid U/S in 3 more years or if she develops sxs  2. Hypothyroidism - latest thyroid labs  reviewed with pt >> normal  - she continues on LT4 100 mcg daily - pt feels good on this dose. - we discussed about taking the thyroid hormone every day, with water, >30 minutes before breakfast, separated by >4 hours from acid reflux medications, calcium, iron, multivitamins. Pt. is taking it correctly, but missed doses. - will check thyroid tests in 1.5 mo as she missed last 8 days of the LT4: TSH and fT4. Discussed about not missing doses anymore.  - RTC in 1 year  Orders Placed This Encounter  Procedures  . T4, free    Standing Status:   Future    Standing Expiration Date:   01/07/2018  . TSH    Standing Status:   Future    Standing Expiration Date:  01/07/2018    Philemon Kingdom, MD PhD [facility]

## 2017-02-10 ENCOUNTER — Other Ambulatory Visit (INDEPENDENT_AMBULATORY_CARE_PROVIDER_SITE_OTHER): Payer: BC Managed Care – PPO

## 2017-02-10 DIAGNOSIS — E039 Hypothyroidism, unspecified: Secondary | ICD-10-CM

## 2017-02-10 LAB — T4, FREE: FREE T4: 1.28 ng/dL (ref 0.60–1.60)

## 2017-02-10 LAB — TSH: TSH: 0.28 u[IU]/mL — ABNORMAL LOW (ref 0.35–4.50)

## 2017-02-11 ENCOUNTER — Other Ambulatory Visit: Payer: BC Managed Care – PPO

## 2017-02-12 ENCOUNTER — Other Ambulatory Visit: Payer: Self-pay | Admitting: Internal Medicine

## 2017-02-12 DIAGNOSIS — E039 Hypothyroidism, unspecified: Secondary | ICD-10-CM

## 2017-02-12 MED ORDER — LEVOTHYROXINE SODIUM 88 MCG PO TABS: 88.0000 ug | ORAL_TABLET | Freq: Every day | ORAL | 5 refills | Status: DC

## 2017-09-20 LAB — HM PAP SMEAR

## 2017-10-25 LAB — HM MAMMOGRAPHY

## 2018-01-10 ENCOUNTER — Ambulatory Visit: Payer: BC Managed Care – PPO | Admitting: Internal Medicine

## 2018-01-12 ENCOUNTER — Encounter: Payer: Self-pay | Admitting: Internal Medicine

## 2018-01-12 ENCOUNTER — Ambulatory Visit: Payer: BC Managed Care – PPO | Admitting: Internal Medicine

## 2018-01-12 VITALS — BP 100/60 | HR 64 | Ht 66.0 in | Wt 133.0 lb

## 2018-01-12 DIAGNOSIS — Z23 Encounter for immunization: Secondary | ICD-10-CM | POA: Diagnosis not present

## 2018-01-12 DIAGNOSIS — E042 Nontoxic multinodular goiter: Secondary | ICD-10-CM

## 2018-01-12 DIAGNOSIS — E039 Hypothyroidism, unspecified: Secondary | ICD-10-CM | POA: Diagnosis not present

## 2018-01-12 LAB — TSH: TSH: 2.32 u[IU]/mL (ref 0.35–4.50)

## 2018-01-12 LAB — T4, FREE: Free T4: 1.02 ng/dL (ref 0.60–1.60)

## 2018-01-12 NOTE — Patient Instructions (Signed)
Please continue Levothyroxine 88 mcg daily.  Take the thyroid hormone every day, with water, at least 30 minutes before breakfast, separated by at least 4 hours from: - acid reflux medications - calcium - iron - multivitamins  Please come back for a follow-up appointment in 1 year.

## 2018-01-12 NOTE — Progress Notes (Signed)
Patient ID: Deborah Yates, female   DOB: Feb 22, 1960, 57 y.o.   MRN: [id number]    HPI  Deborah Yates is a 57 y.o.-year-old female, returning for follow-up for multiple thyroid nodules and hypothyroidism.  Last visit 1 year ago.  Reviewed and addended history: She was dx with MNG in her 56s. Started taking Synthroid 100 mcg in mid-20s to suppress the growth of the nodules.  She continues on Synthroid.  She was having regular thyroid ultrasounds in the past.  Thyroid ultrasound (04/14/2012):   R mid pole nodule 2.2 x 1.6 x 1.2 cm, previously 15 x 9 x 8 mm.   Multiple isthmic nodules, largest 2.1 cm in the right side of the isthmus, previously 9 x 7 x 4 mm (per radiologist, this nodule did not change much in size, upon review of the new and old images)  L superior pole complex nodule 3.5 x 2.6 x 2.6 cm, previously 4.1 x 2.8 x 2.6 cm  L inferior complex pole nodule 3.5 x 3.2 x 2.2 cm, previously 2.9 x 2.5 x 1.8 cm   Lymphadenopathy: None visualized.   Thyroid ultrasound (11/25/2015): IMPRESSION: 1. Thyromegaly with bilateral nodules. 2. Recommend FNA biopsy of 4 cm left mid nodule. 3. Recommend 1 year follow-up ultrasound of 2.4 cm right mid nodule.    Adequacy Reason Satisfactory For Evaluation. Diagnosis THYROID, FINE NEEDLE ASPIRATION LEFT, LMP, (SPECIMEN 1OF 1, COLLECTED ON 01/14/16 CONSISTENT WITH benign follicular nodule ([name] MD Pathologist, Electronic Signature (Case signed 01/15/2016) Specimen Clinical Information Left Mid, 4 x 1.6 x 2cm, mixed cystic and solid isoechoic, TI-RADS points 3 Source Thyroid, Fine Needle Aspiration, Left, LMP, (Specimen 1 of 1, collected on 01/14/16)  Pt is on levothyroxine 100 >> 88 mcg daily, taken: - in am - fasting - at least 30 min from b'fast - no Ca, Fe, MVI, PPIs - not on Biotin  Reviewed patient's TFTs-TSH was slightly low at last check: Lab Results  Component Value Date   TSH 0.28 (L)  02/10/2017   TSH 0.94 01/08/2016   TSH 10.34 (H) 10/29/2015   TSH 0.45 05/02/2015   TSH 0.76 12/03/2014   TSH 10.58 (H) 10/23/2014   TSH 0.07 (L) 06/16/2012   TSH 6.97 (H) 04/11/2012   TSH 0.57 08/26/2011   TSH 0.44 12/29/2010   FREET4 1.28 02/10/2017   FREET4 1.02 01/08/2016   FREET4 0.50 (L) 10/29/2015   FREET4 0.69 10/23/2014   FREET4 1.27 06/16/2012   FREET4 0.76 04/11/2012    Pt denies: - feeling nodules in neck - hoarseness - dysphagia - choking - SOB with lying down  No FH of thyroid ds. No FH of thyroid cancer. No h/o radiation tx to head or neck.  No seaweed or kelp. No recent contrast studies. No herbal supplements. No Biotin use. No recent steroids use.   Started swimming - 3x a week - 30 min.  ROS: Constitutional: no weight gain/no weight loss, no fatigue, no subjective hyperthermia, no subjective hypothermia Eyes: no blurry vision, no xerophthalmia ENT: no sore throat, + see HPI Cardiovascular: no CP/no SOB/no palpitations/no leg swelling Respiratory: no cough/no SOB/no wheezing Gastrointestinal: no N/no V/no D/no C/no acid reflux Musculoskeletal: no muscle aches/no joint aches Skin: no rashes, no hair loss Neurological: no tremors/no numbness/no tingling/no dizziness  I reviewed pt's medications, allergies, PMH, social hx, family hx, and changes were documented in the history of present illness. Otherwise, unchanged from my initial visit note.  Past Medical History:  Diagnosis Date  . Goiter    remote, u/s 2007, repeated 05/2007- stable   Past Surgical History:  Procedure Laterality Date  . SKIN CANCER EXCISION     removed rt arm/rt leg 2008- sees derm 4 times a year   Social History   Social History  . Marital status: Divorced    Spouse name: N/A  . Number of children: 1   Occupational History  . teacher [employer]    [employer]   Social History Main Topics  . Smoking status: Never Smoker  . Smokeless tobacco: Never Used  . Alcohol  use Yes     Comment: socially, Once a month   . Drug use: no   Social History Narrative   Very healthy life style   Current Outpatient Medications on File Prior to Visit  Medication Sig Dispense Refill  . levothyroxine (SYNTHROID, LEVOTHROID) 88 MCG tablet Take 1 tablet (88 mcg total) by mouth daily. 45 tablet 5   Current Facility-Administered Medications on File Prior to Visit  Medication Dose Route Frequency Provider Last Rate Last Dose  . 0.9 %  sodium chloride infusion  500 mL Intravenous Continuous Gatha Mayer, MD       No Known Allergies Family History  Problem Relation Age of Onset  . Colon cancer Brother 33  . Breast cancer Maternal Grandmother 41  . Coronary artery disease Neg Hx   . Hypertension Neg Hx   . Stroke Neg Hx   . Diabetes Neg Hx    PE: LMP 04/13/2012  Wt Readings from Last 3 Encounters:  01/07/17 137 lb 9.6 oz (62.4 kg)  02/19/16 129 lb (58.5 kg)  01/29/16 130 lb (59 kg)   Constitutional: Normal weight, in NAD Eyes: PERRLA, EOMI, no exophthalmos ENT: moist mucous membranes, no thyromegaly, no cervical lymphadenopathy Cardiovascular: RRR, No MRG Respiratory: CTA B Gastrointestinal: abdomen soft, NT, ND, BS+ Musculoskeletal: no deformities, strength intact in all 4 Skin: moist, warm, no rashes Neurological: no tremor with outstretched hands, DTR normal in all 4  ASSESSMENT: 1. Multiple thyroid nodules  2. Hypothyroidism  PLAN: 1. Multiple thyroid nodules -Reviewed the report of her latest thyroid ultrasound along with the patient.  Her thyroid nodules are low risk, and we biopsied the nodule that appeared to have increased in size in the left lobe on the last ultrasound.  This was benign.  She does not have a thyroid cancer family history or personal history of radiation therapy to head or neck.  All of these will favor benignity. - She does not have any neck compression symptoms - We discussed about repeating the ultrasound in 1-2 more  years.  2. Hypothyroidism - latest thyroid labs reviewed with pt >> slightly low 02/2017 - LT4 decreased from 100 >> 88 mcg daily Lab Results  Component Value Date   TSH 0.28 (L) 02/10/2017  - she continues on LT4 88 mcg daily - not missing doses anymore - pt feels good on this dose. - we discussed about taking the thyroid hormone every day, with water, >30 minutes before breakfast, separated by >4 hours from acid reflux medications, calcium, iron, multivitamins. Pt. is taking it correctly. - will check thyroid tests today: TSH and fT4 - If labs are abnormal, she will need to return for repeat TFTs in 1.5 months - RTC in 1 year  + flu shot today  Needs refills.  Component     Latest Ref Rng & Units 01/12/2018  TSH     0.35 -  4.50 uIU/mL 2.32  T4,Free(Direct)     0.60 - 1.60 ng/dL 1.02  Normal labs.  Philemon Kingdom, MD PhD [facility]

## 2018-01-13 MED ORDER — LEVOTHYROXINE SODIUM 88 MCG PO TABS: 88.0000 ug | ORAL_TABLET | Freq: Every day | ORAL | 3 refills | Status: DC

## 2018-09-08 ENCOUNTER — Encounter: Payer: Self-pay | Admitting: Internal Medicine

## 2018-09-26 DIAGNOSIS — C4491 Basal cell carcinoma of skin, unspecified: Secondary | ICD-10-CM | POA: Insufficient documentation

## 2018-09-26 DIAGNOSIS — D259 Leiomyoma of uterus, unspecified: Secondary | ICD-10-CM | POA: Insufficient documentation

## 2018-09-29 ENCOUNTER — Other Ambulatory Visit: Payer: Self-pay

## 2018-09-29 ENCOUNTER — Encounter: Payer: Self-pay | Admitting: Internal Medicine

## 2018-09-29 ENCOUNTER — Ambulatory Visit: Payer: BC Managed Care – PPO | Admitting: Internal Medicine

## 2018-09-29 VITALS — BP 117/71 | HR 64 | Temp 97.1°F | Resp 16 | Ht 67.0 in | Wt 137.2 lb

## 2018-09-29 DIAGNOSIS — Z0000 Encounter for general adult medical examination without abnormal findings: Secondary | ICD-10-CM

## 2018-09-29 DIAGNOSIS — R001 Bradycardia, unspecified: Secondary | ICD-10-CM | POA: Diagnosis not present

## 2018-09-29 LAB — COMPREHENSIVE METABOLIC PANEL
ALT: 15 U/L (ref 0–35)
AST: 16 U/L (ref 0–37)
Albumin: 4.8 g/dL (ref 3.5–5.2)
Alkaline Phosphatase: 75 U/L (ref 39–117)
BUN: 21 mg/dL (ref 6–23)
CO2: 28 mEq/L (ref 19–32)
Calcium: 9.9 mg/dL (ref 8.4–10.5)
Chloride: 103 mEq/L (ref 96–112)
Creatinine, Ser: 0.92 mg/dL (ref 0.40–1.20)
GFR: 62.95 mL/min (ref >60.00)
Glucose, Bld: 91 mg/dL (ref 70–99)
Potassium: 5.3 mEq/L — ABNORMAL HIGH (ref 3.5–5.1)
Sodium: 140 mEq/L (ref 135–145)
Total Bilirubin: 0.8 mg/dL (ref 0.2–1.2)
Total Protein: 7.4 g/dL (ref 6.0–8.3)

## 2018-09-29 LAB — CBC WITH DIFFERENTIAL/PLATELET
Basophils Absolute: 0 10*3/uL (ref 0.0–0.1)
Basophils Relative: 0.7 % (ref 0.0–3.0)
Eosinophils Absolute: 0 10*3/uL (ref 0.0–0.7)
Eosinophils Relative: 1 % (ref 0.0–5.0)
HCT: 44.5 % (ref 36.0–46.0)
Hemoglobin: 14.9 g/dL (ref 12.0–15.0)
Lymphocytes Relative: 25.7 % (ref 12.0–46.0)
Lymphs Abs: 1.1 10*3/uL (ref 0.7–4.0)
MCHC: 33.3 g/dL (ref 30.0–36.0)
MCV: 92.4 fl (ref 78.0–100.0)
Monocytes Absolute: 0.3 10*3/uL (ref 0.1–1.0)
Monocytes Relative: 5.6 % (ref 3.0–12.0)
Neutro Abs: 3 10*3/uL (ref 1.4–7.7)
Neutrophils Relative %: 67 % (ref 43.0–77.0)
Platelets: 201 10*3/uL (ref 150.0–400.0)
RBC: 4.82 Mil/uL (ref 3.87–5.11)
RDW: 13.3 % (ref 11.5–15.5)
WBC: 4.5 10*3/uL (ref 4.0–10.5)

## 2018-09-29 LAB — VITAMIN D 25 HYDROXY (VIT D DEFICIENCY, FRACTURES): VITD: 38.93 ng/mL (ref 30.00–100.00)

## 2018-09-29 LAB — LIPID PANEL
Cholesterol: 232 mg/dL — ABNORMAL HIGH (ref 0–200)
HDL: 76.3 mg/dL (ref >39.00)
LDL Cholesterol: 144 mg/dL — ABNORMAL HIGH (ref 0–99)
NonHDL: 155.73
Total CHOL/HDL Ratio: 3
Triglycerides: 57 mg/dL (ref 0.0–149.0)
VLDL: 11.4 mg/dL (ref 0.0–40.0)

## 2018-09-29 NOTE — Patient Instructions (Signed)
GO TO THE LAB : Get the blood work     GO TO THE FRONT DESK Schedule your next appointment   for a physical exam in 1 year  Recommend vitamin D 1000 units daily  We will call you for EKG as soon as possible

## 2018-09-29 NOTE — Progress Notes (Signed)
Subjective:    Patient ID: Deborah Yates, female    DOB: Feb 25, 1960, 58 y.o.   MRN: [id number]  DOS:  09/29/2018 Type of visit - description: CPX In general feeling well. Her mother was diagnosed with atrial fibrillation at age 45. The patient is extremely active and her apple watch alert her when her heart rate is in the 50s, that happens when she is at home resting. Sometimes her heart rate has been in the 40s. She remains completely asymptomatic.   Review of Systems   Other than above, a 14 point review of systems is negative    Past Medical History:  Diagnosis Date  . Goiter    remote, u/s 2007, repeated 05/2007- stable    Past Surgical History:  Procedure Laterality Date  . SKIN CANCER EXCISION     removed rt arm/rt leg 2008- sees derm 4 times a year    Social History   Socioeconomic History  . Marital status: Divorced    Spouse name: Not on file  . Number of children: 1  . Years of education: Not on file  . Highest education level: Not on file  Occupational History  . Occupation: Product manager: [employer]    Comment: [employer]  Social Needs  . Financial resource strain: Not on file  . Food insecurity    Worry: Not on file    Inability: Not on file  . Transportation needs    Medical: Not on file    Non-medical: Not on file  Tobacco Use  . Smoking status: Never Smoker  . Smokeless tobacco: Never Used  Substance and Sexual Activity  . Alcohol use: Yes    Comment: socially  . Drug use: No  . Sexual activity: Not on file  Lifestyle  . Physical activity    Days per week: Not on file    Minutes per session: Not on file  . Stress: Not on file  Relationships  . Social Herbalist on phone: Not on file    Gets together: Not on file    Attends religious service: Not on file    Active member of club or organization: Not on file    Attends meetings of clubs or organizations: Not on file    Relationship status: Not on  file  . Intimate partner violence    Fear of current or ex partner: Not on file    Emotionally abused: Not on file    Physically abused: Not on file    Forced sexual activity: Not on file  Other Topics Concern  . Not on file  Social History Narrative   Very healthy life style   1 daughter in [place]     Family History  Problem Relation Age of Onset  . Atrial fibrillation Mother 9  . Colon cancer Brother 59  . Breast cancer Maternal Grandmother 91  . Coronary artery disease Neg Hx   . Hypertension Neg Hx   . Stroke Neg Hx   . Diabetes Neg Hx      Allergies as of 09/29/2018   No Known Allergies     Medication List       Accurate as of September 29, 2018 11:59 PM. If you have any questions, ask your nurse or doctor.        levothyroxine 88 MCG tablet Commonly known as: SYNTHROID Take 1 tablet (88 mcg total) by mouth daily.  Objective:   Physical Exam BP 117/71 (BP Location: Left Arm, Patient Position: Sitting, Cuff Size: Small)   Pulse 64   Temp (!) 97.1 F (36.2 C) (Temporal)   Resp 16   Ht 5\' 7"  (1.702 m)   Wt 137 lb 4 oz (62.3 kg)   LMP 04/13/2012   SpO2 100%   BMI 21.50 kg/m  General: Well developed, NAD, BMI noted HEENT:  Normocephalic . Face symmetric, atraumatic Lungs:  CTA B Normal respiratory effort, no intercostal retractions, no accessory muscle use. Heart: RRR,  no murmur.  No pretibial edema bilaterally  Abdomen:  Not distended, soft, non-tender. No rebound or rigidity.  Palpable aorta, no bruit, not tender Skin: Exposed areas without rash. Not pale. Not jaundice Neurologic:  alert & oriented X3.  Speech normal, gait appropriate for age and unassisted Strength symmetric and appropriate for age.  Psych: Cognition and judgment appear intact.  Cooperative with normal attention span and concentration.  Behavior appropriate. No anxious or depressed appearing.     Assessment     Assessment  GOITER, dx remotely-->  u/s 2007,  and  multiple other, saw endo 2014: rec to recheck a [place] ~2016 Menopausal (LMP 2016) G1 P1 Sees dermatology regularly      PLAN Goiter: Under the care of Endo FH A. Fib  noted Bradycardia: EKG machine is down, will call her in few days. She is asymptomatic, I doubt that is of any clinical significance. Her Apple watch can alert her if she has irregular heartbeat, if that is ever the case she will let me know. Palpable aorta: Likely due to patient habitus RTC 1 year

## 2018-09-29 NOTE — Assessment & Plan Note (Addendum)
-   TD 2009, declined booster - shingrix: Discussed, will wait till she is 70 -flu shot at work Female care: sees gyn, , will discuss DEXA w/ them  CCS:  cscope 06-2007, neg cscope 02/2016 , Polyp not precancerous . Recall 5 yrs (FHx CRCA)  - Labs: CMP, FLP, CBC, vitamin D - Has a very healthy lifestyle.  Recommend to add vitamin D supplements

## 2018-09-29 NOTE — Progress Notes (Signed)
Pre visit review using our clinic review tool, if applicable. No additional management support is needed unless otherwise documented below in the visit note. 

## 2018-10-01 NOTE — Assessment & Plan Note (Signed)
Goiter: Under the care of Endo FH A. Fib  noted Bradycardia: EKG machine is down, will call her in few days. She is asymptomatic, I doubt that is of any clinical significance. Her Apple watch can alert her if she has irregular heartbeat, if that is ever the case she will let me know. Palpable aorta: Likely due to patient habitus RTC 1 year

## 2018-10-05 ENCOUNTER — Other Ambulatory Visit: Payer: Self-pay

## 2018-10-05 ENCOUNTER — Ambulatory Visit (INDEPENDENT_AMBULATORY_CARE_PROVIDER_SITE_OTHER): Payer: BC Managed Care – PPO

## 2018-10-05 DIAGNOSIS — R001 Bradycardia, unspecified: Secondary | ICD-10-CM | POA: Diagnosis not present

## 2018-10-05 DIAGNOSIS — Z23 Encounter for immunization: Secondary | ICD-10-CM

## 2018-10-05 NOTE — Progress Notes (Addendum)
Patient came in today to have her CPE EKG completed and also a T-DAP injection, per Dr. Larose Kells. Patient tolerated 05.mL in her left deltoid with no complications. Dr. Larose Kells reviewed patients EKG before she was released off the EKG machine he stated WNL and patient was released.   EKG: Sinus bradycardia, RSR V1.  Essentially normal. JP

## 2018-11-09 LAB — HM MAMMOGRAPHY

## 2018-11-09 LAB — HM DEXA SCAN

## 2019-01-13 ENCOUNTER — Encounter: Payer: Self-pay | Admitting: Internal Medicine

## 2019-01-13 ENCOUNTER — Other Ambulatory Visit: Payer: Self-pay

## 2019-01-13 ENCOUNTER — Ambulatory Visit (INDEPENDENT_AMBULATORY_CARE_PROVIDER_SITE_OTHER): Payer: BC Managed Care – PPO | Admitting: Internal Medicine

## 2019-01-13 DIAGNOSIS — E039 Hypothyroidism, unspecified: Secondary | ICD-10-CM | POA: Diagnosis not present

## 2019-01-13 DIAGNOSIS — R635 Abnormal weight gain: Secondary | ICD-10-CM

## 2019-01-13 DIAGNOSIS — E042 Nontoxic multinodular goiter: Secondary | ICD-10-CM

## 2019-01-13 DIAGNOSIS — E663 Overweight: Secondary | ICD-10-CM

## 2019-01-13 NOTE — Progress Notes (Signed)
Patient ID: Deborah Yates, female   DOB: 12/19/1960, 58 y.o.   MRN: [id number]   Patient location: Home My location: Office Persons participating in the virtual visit: patient, provider  Referring Provider: Colon Branch, MD  I connected with the patient on 01/13/19 at 9:59 AM EST by a video enabled telemedicine application and verified that I am speaking with the correct person.   I discussed the limitations of evaluation and management by telemedicine and the availability of in person appointments. The patient expressed understanding and agreed to proceed.   Details of the encounter are shown below.  HPI  Deborah Yates is a 58 y.o.-year-old female, presenting for follow-up for multiple thyroid nodules and hypothyroidism.  Last visit 1 year ago.  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  Reviewed history: She was dx with MNG in her 57s. Started taking Synthroid 100 mcg in 57s to suppress the growth of the nodules.  She continues on Synthroid.  She was having regular thyroid ultrasounds in the past.  Thyroid ultrasound (04/14/2012):   R mid pole nodule 2.2 x 1.6 x 1.2 cm, previously 15 x 9 x 8 mm.   Multiple isthmic nodules, largest 2.1 cm in the right side of the isthmus, previously 9 x 7 x 4 mm (per radiologist, this nodule did not change much in size, upon review of the new and old images)  L superior pole complex nodule 3.5 x 2.6 x 2.6 cm, previously 4.1 x 2.8 x 2.6 cm  L inferior complex pole nodule 3.5 x 3.2 x 2.2 cm, previously 2.9 x 2.5 x 1.8 cm   Lymphadenopathy: None visualized.   Thyroid ultrasound (11/25/2015): IMPRESSION: 1. Thyromegaly with bilateral nodules. 2. Recommend FNA biopsy of 4 cm left mid nodule. 3. Recommend 1 year follow-up ultrasound of 2.4 cm right mid nodule.    Adequacy Reason Satisfactory For Evaluation. Diagnosis THYROID, FINE NEEDLE ASPIRATION LEFT, LMP, (SPECIMEN 1OF 1, COLLECTED ON 01/14/16 CONSISTENT WITH benign  follicular nodule ([name] MD Pathologist, Electronic Signature (Case signed 01/15/2016) Specimen Clinical Information Left Mid, 4 x 1.6 x 2cm, mixed cystic and solid isoechoic, TI-RADS points 3 Source Thyroid, Fine Needle Aspiration, Left, LMP, (Specimen 1 of 1, collected on 01/14/16)  Pt is on levothyroxine 88 mcg daily, taken: - in am - fasting - at least 30 min from b'fast - no Ca, Fe, MVI, PPIs - not on Biotin  Reviewed patient's TFTs: Lab Results  Component Value Date   TSH 2.32 01/12/2018   TSH 0.28 (L) 02/10/2017   TSH 0.94 01/08/2016   TSH 10.34 (H) 10/29/2015   TSH 0.45 05/02/2015   TSH 0.76 12/03/2014   TSH 10.58 (H) 10/23/2014   TSH 0.07 (L) 06/16/2012   TSH 6.97 (H) 04/11/2012   TSH 0.57 08/26/2011   FREET4 1.02 01/12/2018   FREET4 1.28 02/10/2017   FREET4 1.02 01/08/2016   FREET4 0.50 (L) 10/29/2015   FREET4 0.69 10/23/2014   FREET4 1.27 06/16/2012   FREET4 0.76 04/11/2012    Pt denies: - feeling nodules in neck - hoarseness - dysphagia - choking - SOB with lying down  Patient mentions: - Weight gain - 10 lbs  Denies: - Fatigue - Cold intolerance - Constipation - Hair loss  No FH of thyroid ds. No FH of thyroid cancer. No h/o radiation tx to head or neck.  No herbal supplements. No Biotin use. No recent steroids use.   Started swimming - 3x a week -  30 min. >> continues this.  She was dx'ed with osteopenia >> will start Evista.  She will also start vit D.  ROS: Constitutional: + See HPI Eyes: no blurry vision, no xerophthalmia ENT: no sore throat, + see HPI Cardiovascular: no CP/no SOB/no palpitations/no leg swelling Respiratory: no cough/no SOB/no wheezing Gastrointestinal: no N/no V/no D/no C/no acid reflux Musculoskeletal: no muscle aches/no joint aches Skin: no rashes, no hair loss Neurological: no tremors/no numbness/no tingling/no dizziness  I reviewed pt's medications, allergies, PMH, social hx,  family hx, and changes were documented in the history of present illness. Otherwise, unchanged from my initial visit note.  Past Medical History:  Diagnosis Date  . Goiter    remote, u/s 2007, repeated 05/2007- stable   Past Surgical History:  Procedure Laterality Date  . SKIN CANCER EXCISION     removed rt arm/rt leg 2008- sees derm 4 times a year   Social History   Social History  . Marital status: Divorced    Spouse name: N/A  . Number of children: 1   Occupational History  . teacher [employer]    [employer]   Social History Main Topics  . Smoking status: Never Smoker  . Smokeless tobacco: Never Used  . Alcohol use Yes     Comment: socially, Once a month   . Drug use: no   Social History Narrative   Very healthy life style   Current Outpatient Medications on File Prior to Visit  Medication Sig Dispense Refill  . levothyroxine (SYNTHROID, LEVOTHROID) 88 MCG tablet Take 1 tablet (88 mcg total) by mouth daily. 90 tablet 3   No current facility-administered medications on file prior to visit.    No Known Allergies Family History  Problem Relation Age of Onset  . Atrial fibrillation Mother 63  . Colon cancer Brother 22  . Breast cancer Maternal Grandmother 40  . Coronary artery disease Neg Hx   . Hypertension Neg Hx   . Stroke Neg Hx   . Diabetes Neg Hx    PE: LMP 04/13/2012  Wt Readings from Last 3 Encounters:  09/29/18 137 lb 4 oz (62.3 kg)  01/12/18 133 lb (60.3 kg)  01/07/17 137 lb 9.6 oz (62.4 kg)   Constitutional:  in NAD  The physical exam was not performed (virtual visit).  ASSESSMENT: 1. Multiple thyroid nodules  2. Hypothyroidism  3.  weight gain  PLAN: 1. Multiple thyroid nodules -Reviewed latest ultrasound along with the patient: her thyroid nodules were low risk and we biopsied the nodule that appeared to have increased in size in the left lobe on the last ultrasound.  This was benign.  She does not have a thyroid cancer family history  or personal history of radiation therapy to head or neck.  All these would favor benignity. -No neck compression symptoms -We will repeat another ultrasound now -she prefers to have this done after the next 2 months, due to the coronavirus pandemic.  2. Hypothyroidism - latest thyroid labs reviewed with pt >> normal: Lab Results  Component Value Date   TSH 2.32 01/12/2018   - she continues on LT4 88 mcg daily - pt feels good on this dose except she did have weight gain. - we discussed about taking the thyroid hormone every day, with water, >30 minutes before breakfast, separated by >4 hours from acid reflux medications, calcium, iron, multivitamins. Pt. is taking it correctly. - will check thyroid tests when she returns to the clinic: TSH and fT4 -  If labs are abnormal, she will need to return for repeat TFTs in 1.5 months - RTC in 1 year  3.  Weight gain - She gained 10 pounds during the coronavirus pandemic, related to working from home - We discussed that this may trigger the need to change her levothyroxine dose >> we will need to have her back for TFTs as soon as safe  Orders Placed This Encounter  Procedures  . US THYROID  . xtpit - TSH  . xtpit - free T4   Philemon Kingdom, MD PhD [facility]

## 2019-01-13 NOTE — Patient Instructions (Signed)
Please come back for another set of thyroid labs as soon as safe.  Please continue Levothyroxine 88 mcg daily.  Take the thyroid hormone every day, with water, at least 30 minutes before breakfast, separated by at least 4 hours from: - acid reflux medications - calcium - iron - multivitamins  Please come back for a follow-up appointment in 1 year.

## 2019-01-23 ENCOUNTER — Other Ambulatory Visit: Payer: Self-pay

## 2019-01-23 ENCOUNTER — Other Ambulatory Visit (INDEPENDENT_AMBULATORY_CARE_PROVIDER_SITE_OTHER): Payer: BC Managed Care – PPO

## 2019-01-23 DIAGNOSIS — E039 Hypothyroidism, unspecified: Secondary | ICD-10-CM

## 2019-01-23 LAB — TSH: TSH: 1.86 u[IU]/mL (ref 0.35–4.50)

## 2019-01-23 LAB — T4, FREE: Free T4: 1.23 ng/dL (ref 0.60–1.60)

## 2019-02-22 ENCOUNTER — Encounter: Payer: Self-pay | Admitting: Internal Medicine

## 2019-03-06 ENCOUNTER — Other Ambulatory Visit: Payer: Self-pay | Admitting: Internal Medicine

## 2019-05-08 ENCOUNTER — Ambulatory Visit
Admission: RE | Admit: 2019-05-08 | Discharge: 2019-05-08 | Disposition: A | Discharge status: Home / Self Care | Payer: BC Managed Care – PPO | Source: Ambulatory Visit | Attending: Internal Medicine | Admitting: Internal Medicine

## 2019-05-08 DIAGNOSIS — E042 Nontoxic multinodular goiter: Secondary | ICD-10-CM

## 2019-06-09 ENCOUNTER — Other Ambulatory Visit: Payer: Self-pay | Admitting: Internal Medicine

## 2019-09-08 ENCOUNTER — Encounter: Payer: Self-pay | Admitting: Internal Medicine

## 2019-10-03 ENCOUNTER — Encounter: Payer: BC Managed Care – PPO | Admitting: Internal Medicine

## 2019-10-20 ENCOUNTER — Encounter: Payer: BC Managed Care – PPO | Admitting: Internal Medicine

## 2019-11-14 LAB — HM MAMMOGRAPHY

## 2020-01-18 ENCOUNTER — Other Ambulatory Visit: Payer: Self-pay

## 2020-01-18 ENCOUNTER — Encounter: Payer: Self-pay | Admitting: Internal Medicine

## 2020-01-18 ENCOUNTER — Ambulatory Visit: Payer: BC Managed Care – PPO | Admitting: Internal Medicine

## 2020-01-18 VITALS — BP 110/70 | HR 69 | Ht 67.0 in | Wt 138.8 lb

## 2020-01-18 DIAGNOSIS — E042 Nontoxic multinodular goiter: Secondary | ICD-10-CM | POA: Diagnosis not present

## 2020-01-18 DIAGNOSIS — E039 Hypothyroidism, unspecified: Secondary | ICD-10-CM

## 2020-01-18 DIAGNOSIS — Z23 Encounter for immunization: Secondary | ICD-10-CM | POA: Diagnosis not present

## 2020-01-18 LAB — T4, FREE: Free T4: 1.1 ng/dL (ref 0.60–1.60)

## 2020-01-18 LAB — TSH: TSH: 5.25 u[IU]/mL — ABNORMAL HIGH (ref 0.35–4.50)

## 2020-01-18 NOTE — Patient Instructions (Addendum)
Please stop at the lab.  Please continue Levothyroxine 88 mcg daily.  Take the thyroid hormone every day, with water, at least 30 minutes before breakfast, separated by at least 4 hours from: - acid reflux medications - calcium - iron - multivitamins  Please send me a message in March to order the new U/S.  Please come back for a follow-up appointment in 1 year.

## 2020-01-18 NOTE — Progress Notes (Signed)
Patient ID: Deborah Yates, female   DOB: February 14, 1960, 59 y.o.   MRN: [id number]   This visit occurred during the SARS-CoV-2 public health emergency.  Safety protocols were in place, including screening questions prior to the visit, additional usage of staff PPE, and extensive cleaning of exam room while observing appropriate contact time as indicated for disinfecting solutions.   HPI  Deborah Yates is a 59 y.o.-year-old female, presenting for follow-up for multiple thyroid nodules and hypothyroidism.  Last visit 1 year ago.  She is working from [employer] remotely.  Reviewed and addended history: She was dx with MNG in her 76s. Started taking Synthroid 100 mcg in mid-20s to suppress the growth of the nodules.  She continues on this.  She was having regular thyroid ultrasounds in the past.  Thyroid ultrasound (04/14/2012):   R mid pole nodule 2.2 x 1.6 x 1.2 cm, previously 15 x 9 x 8 mm.   Multiple isthmic nodules, largest 2.1 cm in the right side of the isthmus, previously 9 x 7 x 4 mm (per radiologist, this nodule did not change much in size, upon review of the new and old images)  L superior pole complex nodule 3.5 x 2.6 x 2.6 cm, previously 4.1 x 2.8 x 2.6 cm  L inferior complex pole nodule 3.5 x 3.2 x 2.2 cm, previously 2.9 x 2.5 x 1.8 cm   Lymphadenopathy: None visualized.   Thyroid ultrasound (11/25/2015): IMPRESSION: 1. Thyromegaly with bilateral nodules. 2. Recommend FNA biopsy of 4 cm left mid nodule. 3. Recommend 1 year follow-up ultrasound of 2.4 cm right mid nodule.    Adequacy Reason Satisfactory For Evaluation. Diagnosis THYROID, FINE NEEDLE ASPIRATION LEFT, LMP, (SPECIMEN 1OF 1, COLLECTED ON 01/14/16 CONSISTENT WITH benign follicular nodule ([name] MD Pathologist, Electronic Signature (Case signed 01/15/2016) Specimen Clinical Information Left Mid, 4 x 1.6 x 2cm, mixed cystic and solid isoechoic, TI-RADS points  3 Source Thyroid, Fine Needle Aspiration, Left, LMP, (Specimen 1 of 1, collected on 01/14/16)  Thyroid U/S (05/08/2019): Parenchymal Echotexture: Markedly heterogenous Isthmus: 0.3 cm Right lobe: 3.4 x 1 x 1.6 cm Left lobe: 5.4 x 1.7 x 2.1 cm _______________________________________  Estimated total number of nodules >/= 1 cm: 3 ________________________________________  Nodule # 1: Prior biopsy: N Location: Right; Mid Maximum size: 1.7 cm; Other 2 dimensions: 1.4 x 0.8 cm, previously, 2.4 x 1 x 1.6 cm cm Composition: solid/almost completely solid (2) Echogenicity: isoechoic (1) Change in features: Yes **Given size (>/= 1.5 cm) and appearance, fine needle aspiration of this moderately suspicious nodule should be considered based on TI-RADS criteria. However, this thyroid nodule has significantly decreased in size since the prior study, likely secondary to the loss of its cystic component. This is favored to represent a benign nodule given its stability across multiple prior studies. Additionally, it size is essentially stable since 2014. _________________________________________________________  Nodule # 2: Prior biopsy: Yes Location: Left; Mid Maximum size: 3.2 cm; Other 2 dimensions: 1.6 x 1.5 cm, previously, 4 x 1.6 x 2 cm Composition: mixed cystic and solid (1) Echogenicity: isoechoic (1) Echogenic foci: macrocalcifications (1) This thyroid nodule was previously biopsied. _________________________________________________________  Nodule # 3: Prior biopsy: No Location: Left; Inferior Maximum size: 1.7 cm; Other 2 dimensions: 1.6 x 1.2 cm, previously, 1.9 x 1.1 x 1.7 cm cm Composition: solid/almost completely solid (2) Echogenicity: isoechoic (1) Change in features: Yes *Given size (>/= 1.5 - 2.4 cm) and appearance, a follow-up ultrasound in 1 year should  be considered based on TI-RADS  criteria. _________________________________________________________  There is a 0.8 cm cystic nodule in the inferior left thyroid gland. No worrisome cervical lymph nodes were identified on this study.  IMPRESSION: 1. Multinodular thyroid gland as detailed above. 2. The dominant right-sided thyroid nodule has decreased in size from the prior study, consistent with a benign process. No further follow-up is required for this thyroid nodule. 3. The dominant left-sided thyroid nodule measuring 3.2 cm has significantly decreased in size since the prior study. This thyroid nodule was previously biopsied. Correlation with prior biopsy results is recommended. 4. Interval decrease in size of the 1.7 cm thyroid nodule as detailed above. This thyroid nodule is essentially unchanged since 2014. No further follow-up is required.  Pt is on levothyroxine 88 mcg daily, taken: - in am - fasting - at least 30 min from b'fast - no calcium - no iron - no multivitamins - no PPIs - not on Biotin  Reviewed her TFTs: Lab Results  Component Value Date   TSH 1.86 01/23/2019   TSH 2.32 01/12/2018   TSH 0.28 (L) 02/10/2017   TSH 0.94 01/08/2016   TSH 10.34 (H) 10/29/2015   TSH 0.45 05/02/2015   TSH 0.76 12/03/2014   TSH 10.58 (H) 10/23/2014   TSH 0.07 (L) 06/16/2012   TSH 6.97 (H) 04/11/2012   FREET4 1.23 01/23/2019   FREET4 1.02 01/12/2018   FREET4 1.28 02/10/2017   FREET4 1.02 01/08/2016   FREET4 0.50 (L) 10/29/2015   FREET4 0.69 10/23/2014   FREET4 1.27 06/16/2012   FREET4 0.76 04/11/2012    Pt denies: - feeling nodules in neck - hoarseness - dysphagia - choking - SOB with lying down  At last visit, she had weight gain, 10 pounds in the previous year.  No FH of thyroid cancer. No h/o radiation tx to head or neck.  No herbal supplements. No Biotin use. No recent steroids use.   Before last visit she started swimming 30 minutes 3 times a week.  She continues this.  She was  also diagnosed with osteopenia>> started Evista.  She is also on vitamin D.  She had microscopic hematuria since last OV >> sees urology >> will have a CT scan soon.  ROS: Constitutional: no weight gain/no weight loss, no fatigue, no subjective hyperthermia, no subjective hypothermia Eyes: no blurry vision, no xerophthalmia ENT: no sore throat, + see HPI Cardiovascular: no CP/no SOB/no palpitations/no leg swelling Respiratory: no cough/no SOB/no wheezing Gastrointestinal: no N/no V/no D/no C/no acid reflux Musculoskeletal: no muscle aches/no joint aches Skin: no rashes, no hair loss Neurological: no tremors/no numbness/no tingling/no dizziness  I reviewed pt's medications, allergies, PMH, social hx, family hx, and changes were documented in the history of present illness. Otherwise, unchanged from my initial visit note.  Past Medical History:  Diagnosis Date  . Goiter    remote, u/s 2007, repeated 05/2007- stable   Past Surgical History:  Procedure Laterality Date  . SKIN CANCER EXCISION     removed rt arm/rt leg 2008- sees derm 4 times a year   Social History   Social History  . Marital status: Divorced    Spouse name: N/A  . Number of children: 1   Occupational History  . teacher [employer]    [employer]   Social History Main Topics  . Smoking status: Never Smoker  . Smokeless tobacco: Never Used  . Alcohol use Yes     Comment: socially, Once a month   . Drug  use: no   Social History Narrative   Very healthy life style   Current Outpatient Medications on File Prior to Visit  Medication Sig Dispense Refill  . levothyroxine (SYNTHROID) 88 MCG tablet TAKE ONE TABLET BY MOUTH ONCE DAILY  90 tablet 1   No current facility-administered medications on file prior to visit.   No Known Allergies Family History  Problem Relation Age of Onset  . Atrial fibrillation Mother 28  . Colon cancer Brother 13  . Breast cancer Maternal Grandmother 86  . Coronary artery  disease Neg Hx   . Hypertension Neg Hx   . Stroke Neg Hx   . Diabetes Neg Hx    PE: BP 110/70   Pulse 69   Ht 5\' 7"  (1.702 m)   Wt 138 lb 12.8 oz (63 kg)   LMP 04/13/2012   SpO2 96%   BMI 21.74 kg/m  Wt Readings from Last 3 Encounters:  01/18/20 138 lb 12.8 oz (63 kg)  09/29/18 137 lb 4 oz (62.3 kg)  01/12/18 133 lb (60.3 kg)   Constitutional: normal weight, in NAD Eyes: PERRLA, EOMI, no exophthalmos ENT: moist mucous membranes, no thyromegaly, no cervical lymphadenopathy Cardiovascular: RRR, No MRG Respiratory: CTA B Gastrointestinal: abdomen soft, NT, ND, BS+ Musculoskeletal: no deformities, strength intact in all 4 Skin: moist, warm, no rashes Neurological: no tremor with outstretched hands, DTR normal in all 4  ASSESSMENT: 1. Multiple thyroid nodules  2. Hypothyroidism  PLAN: 1. Multiple thyroid nodules -Reviewed her thyroid nodule history along with the patient.  Her thyroid nodules were low risk and we biopsied the L thyroid nodule that appears to to have increased in size with benign results.  She does not have a thyroid cancer family history or personal history of radiation therapy to head or neck to increase her risk of cancer. -However, at last visit, we ordered another thyroid ultrasound which was checked on 05/08/2019.  Her nodules appear to be stable in one of them even decreased in size.  However, there was an indication to repeat the thyroid ultrasound in a year.  We will order this now.  Nodules appear to be stable, no further imaging is necessary. -No neck compression symptoms  2. Hypothyroidism - latest thyroid labs reviewed with pt >> normal: Lab Results  Component Value Date   TSH 1.86 01/23/2019   - she continues on LT4 88 mcg daily - pt feels good on this dose. - we discussed about taking the thyroid hormone every day, with water, >30 minutes before breakfast, separated by >4 hours from acid reflux medications, calcium, iron, multivitamins. Pt. is  taking it correctly. - will check thyroid tests today: TSH and fT4 - If labs are abnormal, she will need to return for repeat TFTs in 1.5 months - RTC in 1 year  Needs refills.  Component     Latest Ref Rng & Units 01/18/2020  TSH     0.35 - 4.50 uIU/mL 5.25 (H)  T4,Free(Direct)     0.60 - 1.60 ng/dL 1.10   TSH has increased. We will increase her levothyroxine dose to 100 mcg daily and recheck her test in 1.5 months  Philemon Kingdom, MD PhD [facility]

## 2020-01-19 MED ORDER — LEVOTHYROXINE SODIUM 100 MCG PO TABS: 100.0000 ug | ORAL_TABLET | Freq: Every day | ORAL | 3 refills | Status: DC

## 2020-04-22 ENCOUNTER — Encounter: Payer: Self-pay | Admitting: Internal Medicine

## 2020-06-17 ENCOUNTER — Encounter: Payer: Self-pay | Admitting: Internal Medicine

## 2021-01-23 ENCOUNTER — Ambulatory Visit: Payer: BC Managed Care – PPO | Admitting: Internal Medicine

## 2021-01-23 ENCOUNTER — Other Ambulatory Visit: Payer: Self-pay

## 2021-01-23 ENCOUNTER — Encounter: Payer: Self-pay | Admitting: Internal Medicine

## 2021-01-23 VITALS — BP 120/78 | HR 61 | Ht 67.0 in | Wt 136.8 lb

## 2021-01-23 DIAGNOSIS — Z23 Encounter for immunization: Secondary | ICD-10-CM

## 2021-01-23 DIAGNOSIS — E042 Nontoxic multinodular goiter: Secondary | ICD-10-CM | POA: Diagnosis not present

## 2021-01-23 DIAGNOSIS — E039 Hypothyroidism, unspecified: Secondary | ICD-10-CM | POA: Diagnosis not present

## 2021-01-23 LAB — TSH: TSH: 0.28 u[IU]/mL — ABNORMAL LOW (ref 0.35–5.50)

## 2021-01-23 LAB — T4, FREE: Free T4: 1.5 ng/dL (ref 0.60–1.60)

## 2021-01-23 MED ORDER — LEVOTHYROXINE SODIUM 88 MCG PO TABS: 88.0000 ug | ORAL_TABLET | Freq: Every day | ORAL | 3 refills | Status: DC

## 2021-01-23 NOTE — Patient Instructions (Signed)
Please stop at the lab.  Please continue Levothyroxine 100 mcg daily.  Take the thyroid hormone every day, with water, at least 30 minutes before breakfast, separated by at least 4 hours from: - acid reflux medications - calcium - iron - multivitamins  Please come back for a follow-up appointment in 1 year.

## 2021-01-23 NOTE — Progress Notes (Signed)
Patient ID: [name], female   DOB: [birth year], 60 y.o.   MRN: [id number]   This visit occurred during the SARS-CoV-2 public health emergency.  Safety protocols were in place, including screening questions prior to the visit, additional usage of staff PPE, and extensive cleaning of exam room while observing appropriate contact time as indicated for disinfecting solutions.   HPI  Deborah Yates is a 60 y.o.-year-old female, presenting for follow-up for multiple thyroid nodules and hypothyroidism.  Last visit 1 year ago.  Interim history: At this visit, she feels well, without complaints.   She denies tremors, palpitations, unintentional weight loss, heat intolerance.  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 Started taking Synthroid 100 mcg in mid-59s to suppress the growth of the nodules.  She continues on this.  She was having regular thyroid ultrasounds in the past.  Thyroid ultrasound (04/14/2012):  R mid pole nodule 2.2 x 1.6 x 1.2 cm, previously 15 x 9 x 8 mm.  Multiple isthmic nodules, largest 2.1 cm in the right side of the isthmus, previously 9 x 7 x 4 mm (per radiologist, this nodule did not change much in size, upon review of the new and old images) L superior pole complex nodule 3.5 x 2.6 x 2.6 cm, previously 4.1 x 2.8 x 2.6 cm L inferior complex pole nodule 3.5 x 3.2 x 2.2 cm, previously 2.9 x 2.5 x 1.8 cm  Lymphadenopathy: None visualized.   Thyroid ultrasound (11/25/2015): IMPRESSION: 1. Thyromegaly with bilateral nodules. 2. Recommend FNA biopsy of 4 cm left mid nodule. 3. Recommend 1 year follow-up ultrasound of 2.4 cm right mid nodule.    Adequacy Reason Satisfactory For Evaluation. Diagnosis THYROID, FINE NEEDLE ASPIRATION LEFT, LMP, (SPECIMEN 1OF 1, COLLECTED ON 01/14/16 CONSISTENT WITH benign follicular nodule (B[facility]ESDA CATEGORY II). Deborah Cutter MD Pathologist, Electronic Signature (Case signed 01/15/2016) Specimen Clinical  Information Left Mid, 4 x 1.6 x 2cm, mixed cystic and solid isoechoic, TI-RADS points 3 Source Thyroid, Fine Needle Aspiration, Left, LMP, (Specimen 1 of 1, collected on 01/14/16)  Thyroid U/S (05/08/2019): Parenchymal Echotexture: Markedly heterogenous Isthmus: 0.3 cm Right lobe: 3.4 x 1 x 1.6 cm Left lobe: 5.4 x 1.7 x 2.1 cm  _______________________________________   Estimated total number of nodules >/= 1 cm: 3 ________________________________________   Nodule # 1: Prior biopsy: N  Location: Right; Mid Maximum size: 1.7 cm; Other 2 dimensions: 1.4 x 0.8 cm, previously, 2.4 x 1 x 1.6 cm cm Composition: solid/almost completely solid (2) Echogenicity: isoechoic (1) Change in features: Yes **Given size (>/= 1.5 cm) and appearance, fine needle aspiration of this moderately suspicious nodule should be considered based on TI-RADS criteria. However, this thyroid nodule has significantly decreased in size since the prior study, likely secondary to the loss of its cystic component. This is favored to represent a benign nodule given its stability across multiple prior studies. Additionally, it size is essentially stable since 2014.  _________________________________________________________   Nodule # 2: Prior biopsy: Yes Location: Left; Mid Maximum size: 3.2 cm; Other 2 dimensions: 1.6 x 1.5 cm, previously, 4 x 1.6 x 2 cm Composition: mixed cystic and solid (1)  Echogenicity: isoechoic (1) Echogenic foci: macrocalcifications (1) This thyroid nodule was previously biopsied. _________________________________________________________   Nodule # 3:  Prior biopsy: No Location: Left; Inferior Maximum size: 1.7 cm; Other 2 dimensions: 1.6 x 1.2 cm, previously, 1.9 x 1.1 x 1.7 cm cm Composition: solid/almost completely solid (2)  Echogenicity: isoechoic (1) Change in  features: Yes *Given size (>/= 1.5 - 2.4 cm) and appearance, a follow-up ultrasound in 1 year should be considered based  on TI-RADS criteria.  _________________________________________________________   There is a 0.8 cm cystic nodule in the inferior left thyroid gland. No worrisome cervical lymph nodes were identified on this study.   IMPRESSION: 1. Multinodular thyroid gland as detailed above. 2. The dominant right-sided thyroid nodule has decreased in size from the prior study, consistent with a benign process. No further follow-up is required for this thyroid nodule. 3. The dominant left-sided thyroid nodule measuring 3.2 cm has significantly decreased in size since the prior study. This thyroid nodule was previously biopsied. Correlation with prior biopsy results is recommended. 4. Interval decrease in size of the 1.7 cm thyroid nodule as detailed above. This thyroid nodule is essentially unchanged since 2014. No further follow-up is required.  Pt is on levothyroxine 100 mcg daily, taken: - in am - fasting - at least 30 min from b'fast - no calcium - no iron - + multivitamins - at night, occasionally - no PPIs - not on Biotin  Reviewed her TFTs: Lab Results  Component Value Date   TSH 5.25 (H) 01/18/2020   TSH 1.86 01/23/2019   TSH 2.32 01/12/2018   TSH 0.28 (L) 02/10/2017   TSH 0.94 01/08/2016   TSH 10.34 (H) 10/29/2015   TSH 0.45 05/02/2015   TSH 0.76 12/03/2014   TSH 10.58 (H) 10/23/2014   TSH 0.07 (L) 06/16/2012   FREET4 1.10 01/18/2020   FREET4 1.23 01/23/2019   FREET4 1.02 01/12/2018   FREET4 1.28 02/10/2017   FREET4 1.02 01/08/2016   FREET4 0.50 (L) 10/29/2015   FREET4 0.69 10/23/2014   FREET4 1.27 06/16/2012   FREET4 0.76 04/11/2012    Pt denies: - feeling nodules in neck - hoarseness - dysphagia - choking - SOB with lying down  No FH of thyroid cancer. No h/o radiation tx to head or neck.  No herbal supplements. No Biotin use. No recent steroids use.   She  continues swimming for exercise. She also has osteopenia >> started Evista.  She is also on vitamin  D. She has a history of microscopic hematuria.  She sees urology.  A CT abdomen and further investigation were negative for any pathology.  She is asymptomatic.  ROS: + see HPI  I reviewed pt's medications, allergies, PMH, social hx, family hx, and changes were documented in the history of present illness. Otherwise, unchanged from my initial visit note.  Past Medical History:  Diagnosis Date   Goiter    remote, u/s 2007, repeated 05/2007- stable   Past Surgical History:  Procedure Laterality Date   SKIN CANCER EXCISION     removed rt arm/rt leg 2008- sees derm 4 times a year   Social History   Social History   Marital status: Divorced    Spouse name: N/A   Number of children: 1   Occupational History   Software engineer    [employer]   Social History Main Topics   Smoking status: Never Smoker   Smokeless tobacco: Never Used   Alcohol use Yes     Comment: socially, Once a month    Drug use: no   Social History Narrative   Very healthy life style   Current Outpatient Medications on File Prior to Visit  Medication Sig Dispense Refill   levothyroxine (SYNTHROID) 100 MCG tablet Take 1 tablet (100 mcg total) by mouth daily. 90 tablet 3  No current facility-administered medications on file prior to visit.   No Known Allergies Family History  Problem Relation Age of Onset   Atrial fibrillation Mother 62   Colon cancer Brother 68   Breast cancer Maternal Grandmother 75   Coronary artery disease Neg Hx    Hypertension Neg Hx    Stroke Neg Hx    Diabetes Neg Hx    PE: BP 120/78 (BP Location: Right Arm, Patient Position: Sitting, Cuff Size: Normal)    Pulse 61    Ht 5\' 7"  (1.702 m)    Wt 136 lb 12.8 oz (62.1 kg)    LMP 04/13/2012    SpO2 97%    BMI 21.43 kg/m  Wt Readings from Last 3 Encounters:  01/23/21 136 lb 12.8 oz (62.1 kg)  01/18/20 138 lb 12.8 oz (63 kg)  09/29/18 137 lb 4 oz (62.3 kg)   Constitutional: normal weight, in NAD Eyes: PERRLA, EOMI, no  exophthalmos ENT: moist mucous membranes, no thyromegaly, no cervical lymphadenopathy Cardiovascular: RRR, No MRG Respiratory: CTA B Musculoskeletal: no deformities, strength intact in all 4 Skin: moist, warm, no rashes Neurological: no tremor with outstretched hands, DTR normal in all 4  ASSESSMENT: 1. Multiple thyroid nodules  2. Hypothyroidism  PLAN: 1. Multiple thyroid nodules -Patient has a history of low risk thyroid nodules.  Previous biopsies of the left thyroid nodule that appeared to have increased in size and the results were benign.  She did not have a thyroid cancer family history of personal history of radiation therapy to head or neck to increase her risk of cancer.  -We repeated a thyroid ultrasound on 05/08/2019.  Her nodules appeared to be stable and one of them even decreased in size.  We decided to repeat an ultrasound now and if nodules appear stable, to follow her clinically afterwards. -She denies neck compression symptoms  2. Hypothyroidism - latest thyroid labs reviewed with pt. >> TSH was elevated so I suggested an increase in levothyroxine dose but she did not return for labs afterwards.  At this visit, she tells me that before last visit, she may have missed levothyroxine doses... I advised her at this visit that she needs to let me know right away if she misses doses in the future. Lab Results  Component Value Date   TSH 5.25 (H) 01/18/2020  - she continues on LT4 100 mcg daily - pt feels good on this dose. - we discussed about taking the thyroid hormone every day, with water, >30 minutes before breakfast, separated by >4 hours from acid reflux medications, calcium, iron, multivitamins. Pt. is taking it correctly. - will check thyroid tests today: TSH and fT4 - If labs are abnormal, she will need to return for repeat TFTs in 1.5 months - OTW, will advise her to return in a year  Component     Latest Ref Rng & Units 01/23/2021  TSH     0.35 - 5.50 uIU/mL  0.28 (L)  T4,Free(Direct)     0.60 - 1.60 ng/dL 1.50  TSH is suppressed.  We will back off the levothyroxine dose to 88 mcg daily and recheck her tests in 1.5 mo.  Philemon Kingdom, MD PhD [facility] Endocrinology

## 2021-03-18 ENCOUNTER — Other Ambulatory Visit (INDEPENDENT_AMBULATORY_CARE_PROVIDER_SITE_OTHER): Payer: BC Managed Care – PPO

## 2021-03-18 ENCOUNTER — Other Ambulatory Visit: Payer: Self-pay

## 2021-03-18 DIAGNOSIS — E039 Hypothyroidism, unspecified: Secondary | ICD-10-CM

## 2021-03-18 LAB — TSH: TSH: 3.37 u[IU]/mL (ref 0.35–5.50)

## 2021-03-18 LAB — T4, FREE: Free T4: 1.11 ng/dL (ref 0.60–1.60)

## 2021-04-28 ENCOUNTER — Encounter: Payer: Self-pay | Admitting: Internal Medicine

## 2021-06-10 ENCOUNTER — Other Ambulatory Visit (INDEPENDENT_AMBULATORY_CARE_PROVIDER_SITE_OTHER): Payer: BC Managed Care – PPO

## 2021-06-10 ENCOUNTER — Other Ambulatory Visit: Payer: Self-pay | Admitting: Internal Medicine

## 2021-06-10 DIAGNOSIS — E039 Hypothyroidism, unspecified: Secondary | ICD-10-CM

## 2021-06-10 LAB — T4, FREE: Free T4: 1.13 ng/dL (ref 0.60–1.60)

## 2021-06-10 LAB — TSH: TSH: 0.68 u[IU]/mL (ref 0.35–5.50)

## 2021-06-11 ENCOUNTER — Encounter: Payer: Self-pay | Admitting: Internal Medicine

## 2021-06-11 DIAGNOSIS — E039 Hypothyroidism, unspecified: Secondary | ICD-10-CM

## 2021-06-11 MED ORDER — LEVOTHYROXINE SODIUM 88 MCG PO TABS: 88.0000 ug | ORAL_TABLET | Freq: Every day | ORAL | 1 refills | Status: DC

## 2021-07-17 IMAGING — US US THYROID
1 series · 12 of 25 positions shown · non-contrast
Comparison: 11/25/2015.  Ultrasound dated April 13, 2012.

CLINICAL DATA: Thyroid nodule follow-up

EXAM:
THYROID ULTRASOUND
TECHNIQUE: Ultrasound examination of the thyroid gland and adjacent soft
tissues was performed.

[Series 1: us thyroid · 0.03mm/px · 12 of 60 slices shown]
[im 3/60]
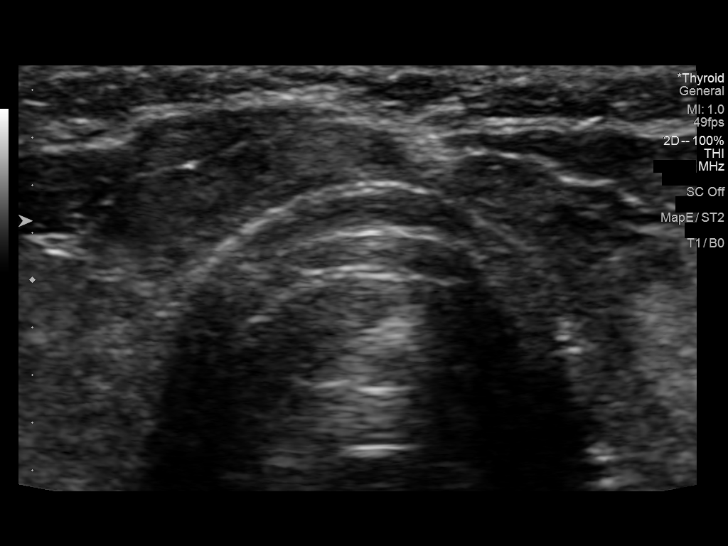
[im 8/60]
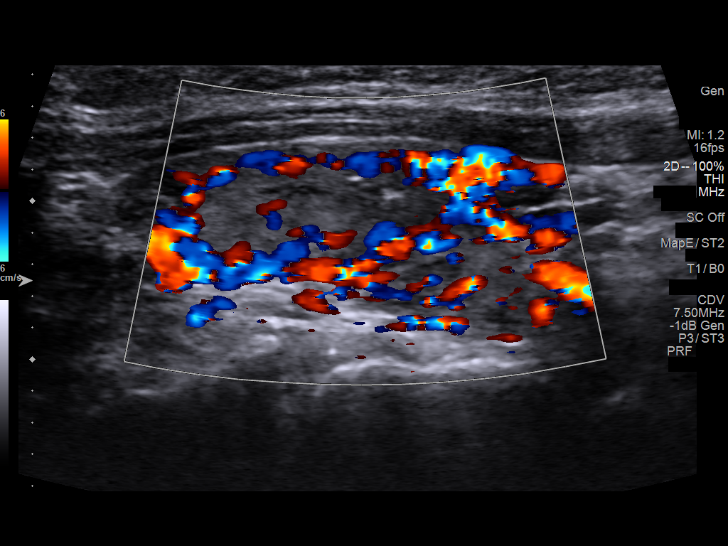
[im 13/60]
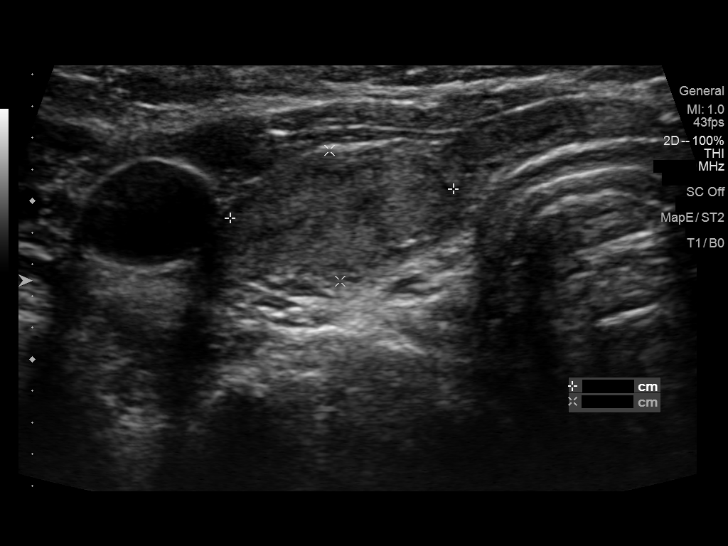
[im 18/60]
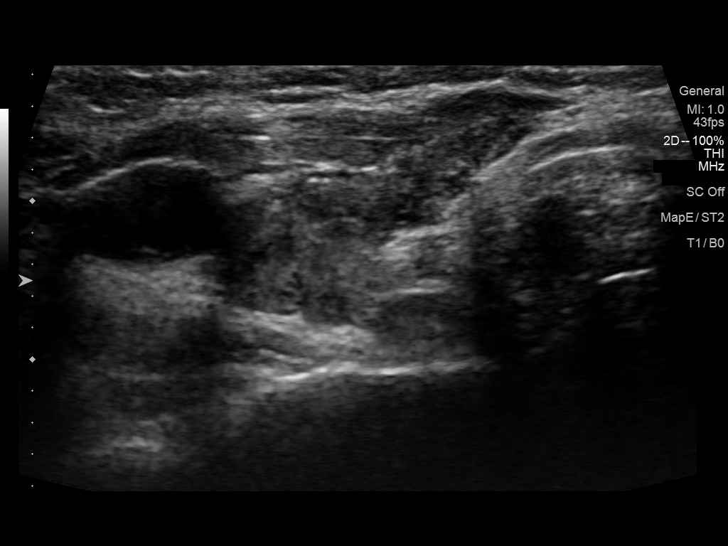
[im 23/60]
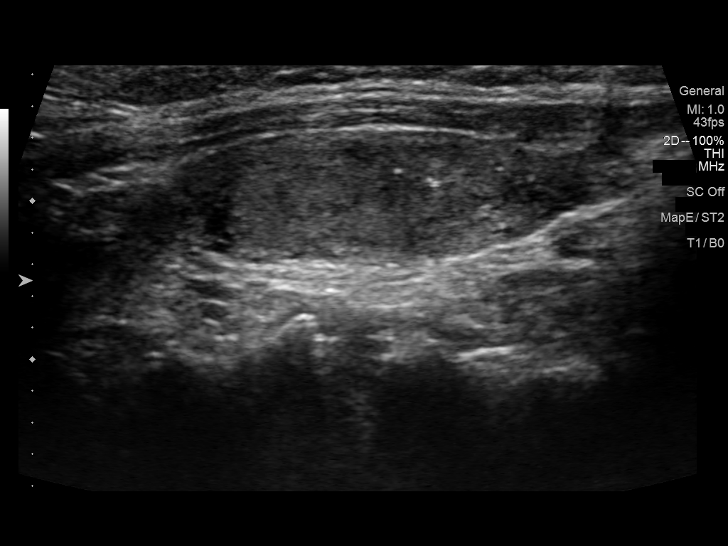
[im 28/60]
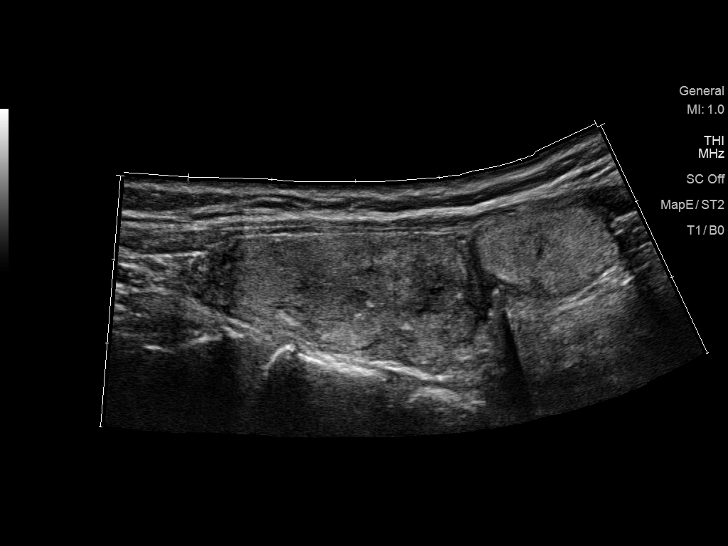
[im 32/60]
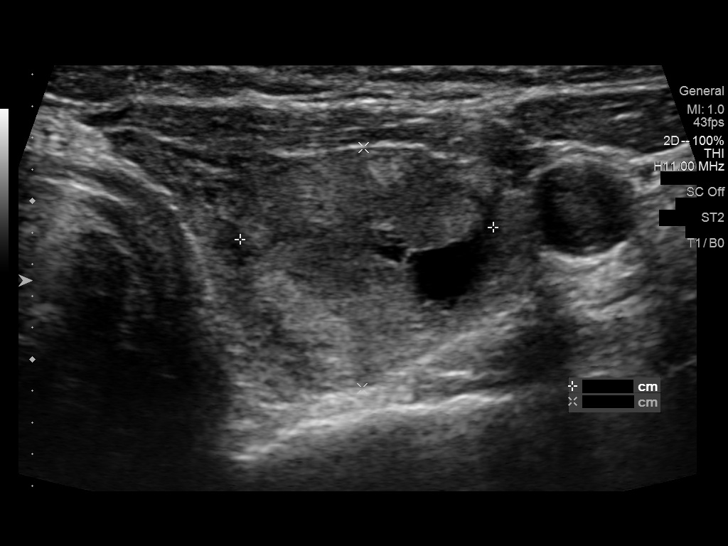
[im 37/60]
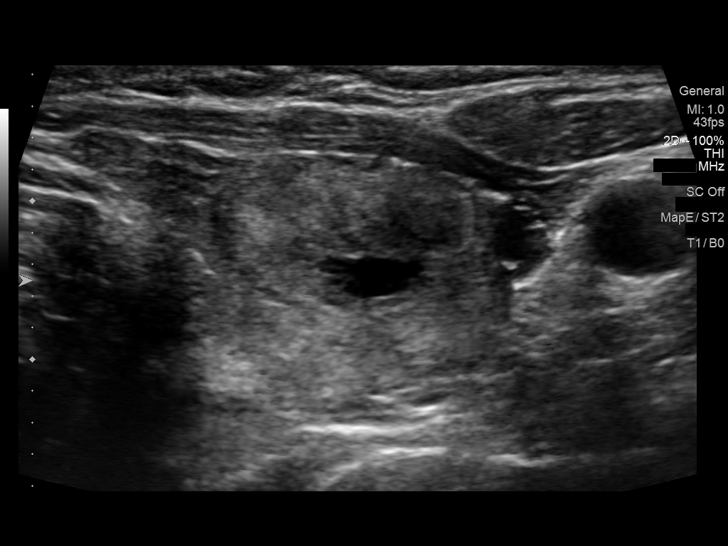
[im 42/60]
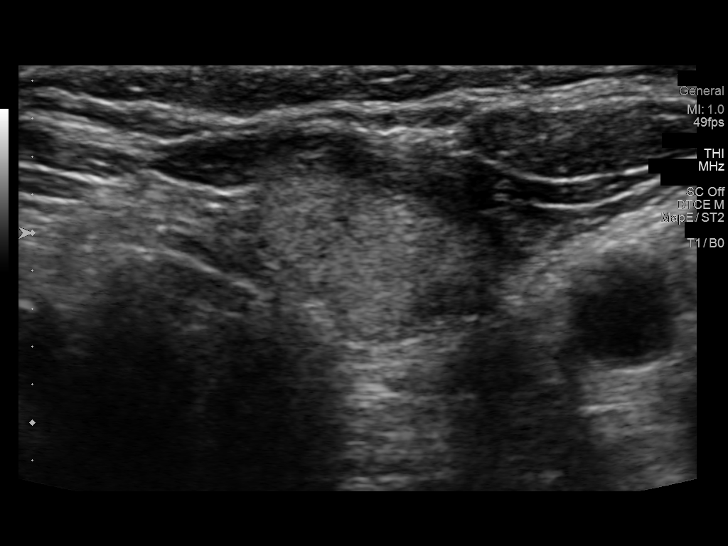
[im 47/60]
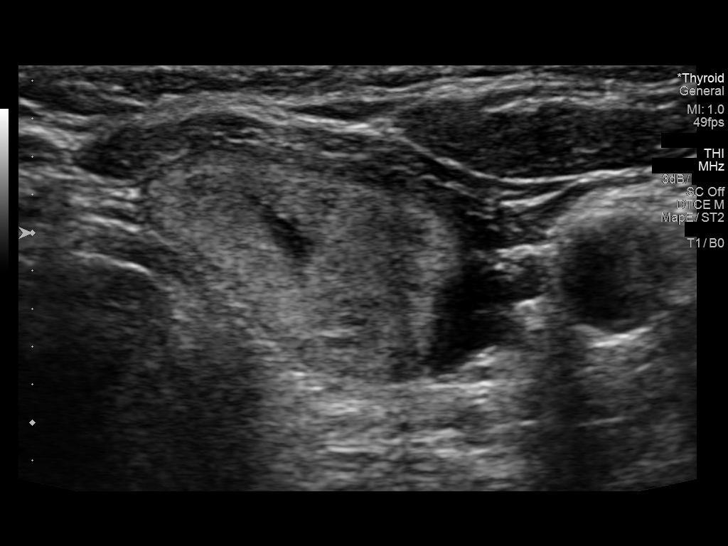
[im 52/60]
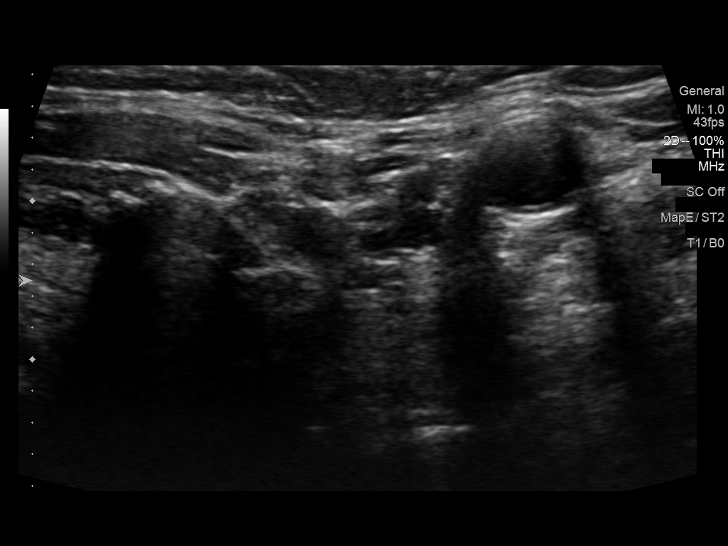
[im 57/60]
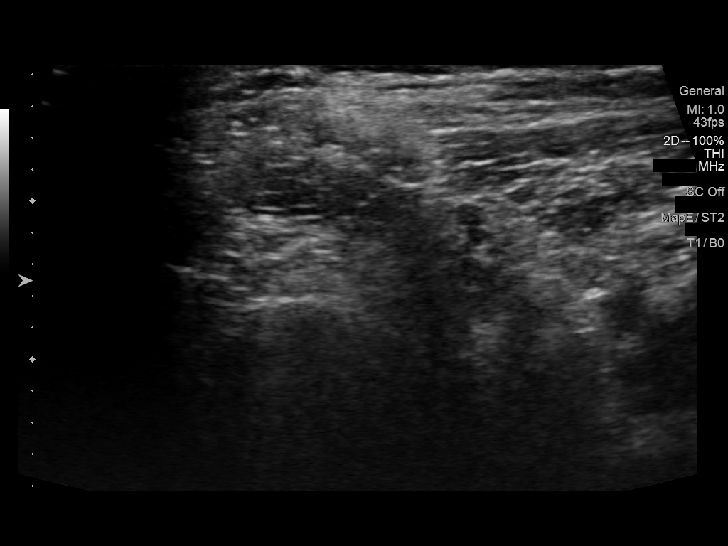

[12 of 25 positions shown; findings below may reference images not displayed]

FINDINGS: Parenchymal Echotexture: Markedly heterogenous

Isthmus: 0.3 cm

Right lobe: 3.4 x 1 x 1.6 cm

Left lobe: 5.4 x 1.7 x 2.1 cm

_________________________________________________________

Estimated total number of nodules >/= 1 cm: 3

Number of spongiform nodules >/=  2 cm not described below (TR1): 0

Number of mixed cystic and solid nodules >/= 1.5 cm not described
below (TR2): 0

_________________________________________________________

Nodule # 1:

Prior biopsy: No

Location: Right; Mid

Maximum size: 1.7 cm; Other 2 dimensions: 1.4 x 0.8 cm, previously,
2.4 x 1 x 1.6 cm cm

Composition: solid/almost completely solid (2)

Echogenicity: isoechoic (1)

Shape: not taller-than-wide (0)

Margins: ill-defined (0)

Echogenic foci: macrocalcifications (1)

ACR TI-RADS total points: 4.

ACR TI-RADS risk category:  TR4 (4-6 points).

Significant change in size (>/= 20% in two dimensions and minimal
increase of 2 mm): No

Change in features: Yes

Change in ACR TI-RADS risk category: Yes

ACR TI-RADS recommendations:

**Given size (>/= 1.5 cm) and appearance, fine needle aspiration of
this moderately suspicious nodule should be considered based on
TI-RADS criteria. However, this thyroid nodule has significantly
decreased in size since the prior study, likely secondary to the
loss of its cystic component. This is favored to represent a benign
nodule given its stability across multiple prior studies.
Additionally, it size is essentially stable since 1661.

_________________________________________________________

Nodule # 2:

Prior biopsy: Yes

Location: Left; Mid

Maximum size: 3.2 cm; Other 2 dimensions: 1.6 x 1.5 cm, previously,
4 x 1.6 x 2 cm

Composition: mixed cystic and solid (1)

Echogenicity: isoechoic (1)

Shape: not taller-than-wide (0)

Margins: smooth (0)

Echogenic foci: macrocalcifications (1)

ACR TI-RADS total points: 3.

ACR TI-RADS risk category:  TR3 (3 points).

Significant change in size (>/= 20% in two dimensions and minimal
increase of 2 mm): No

Change in features: No

Change in ACR TI-RADS risk category: No

ACR TI-RADS recommendations:

This thyroid nodule was previously biopsied.

_________________________________________________________

Nodule # 3:

Prior biopsy: No

Location: Left; Inferior

Maximum size: 1.7 cm; Other 2 dimensions: 1.6 x 1.2 cm, previously,
1.9 x 1.1 x 1.7 cm cm

Composition: solid/almost completely solid (2)

Echogenicity: isoechoic (1)

Shape: not taller-than-wide (0)

Margins: smooth (0)

Echogenic foci: none (0)

ACR TI-RADS total points: 3.

ACR TI-RADS risk category:  TR3 (3 points).

Significant change in size (>/= 20% in two dimensions and minimal
increase of 2 mm): No

Change in features: Yes

Change in ACR TI-RADS risk category: Yes

ACR TI-RADS recommendations:

*Given size (>/= 1.5 - 2.4 cm) and appearance, a follow-up
ultrasound in 1 year should be considered based on TI-RADS criteria.

_________________________________________________________

There is a 0.8 cm cystic nodule in the inferior left thyroid gland.
No worrisome cervical lymph nodes were identified on this study.
IMPRESSION: 1. Multinodular thyroid gland as detailed above.
2. The dominant right-sided thyroid nodule has decreased in size
from the prior study, consistent with a benign process. No further
follow-up is required for this thyroid nodule.
3. The dominant left-sided thyroid nodule measuring 3.2 cm has
significantly decreased in size since the prior study. This thyroid
nodule was previously biopsied. Correlation with prior biopsy
results is recommended.
4. Interval decrease in size of the 1.7 cm thyroid nodule as
detailed above. This thyroid nodule is essentially unchanged since
1661. No further follow-up is required.

The above is in keeping with the ACR TI-RADS recommendations - [HOSPITAL] 5225;[DATE].

## 2021-11-10 ENCOUNTER — Other Ambulatory Visit: Payer: Self-pay | Admitting: Internal Medicine

## 2021-11-10 DIAGNOSIS — E039 Hypothyroidism, unspecified: Secondary | ICD-10-CM

## 2021-11-27 ENCOUNTER — Telehealth: Payer: Self-pay

## 2021-11-27 NOTE — Telephone Encounter (Signed)
Pt called to follow up with scheduling for thyroid u/s. Looks like multiple attempts were made to schedule.

## 2022-01-22 ENCOUNTER — Telehealth: Payer: Self-pay

## 2022-01-22 NOTE — Telephone Encounter (Signed)
Okay to reestablish at her earliest convenience

## 2022-01-22 NOTE — Telephone Encounter (Signed)
Okay to schedule NP appt. Thank you.

## 2022-01-22 NOTE — Telephone Encounter (Signed)
Please advise 

## 2022-01-22 NOTE — Telephone Encounter (Signed)
Pt called stating she would like to schedule a CPE with Dr. Larose Kells. After reviewing chart, noticed that pt had a 3+ year gap in care and was no longer established with Dr. Larose Kells. Advised pt a note would have to be sent back to re-establish care with Dr. Larose Kells. Pt acknowledged understanding.

## 2022-01-23 ENCOUNTER — Ambulatory Visit: Payer: BC Managed Care – PPO | Admitting: Internal Medicine

## 2022-01-23 ENCOUNTER — Encounter: Payer: Self-pay | Admitting: Internal Medicine

## 2022-01-23 VITALS — BP 100/66 | HR 65 | Ht 67.0 in | Wt 143.0 lb

## 2022-01-23 DIAGNOSIS — E039 Hypothyroidism, unspecified: Secondary | ICD-10-CM | POA: Diagnosis not present

## 2022-01-23 DIAGNOSIS — E042 Nontoxic multinodular goiter: Secondary | ICD-10-CM | POA: Diagnosis not present

## 2022-01-23 DIAGNOSIS — Z23 Encounter for immunization: Secondary | ICD-10-CM | POA: Diagnosis not present

## 2022-01-23 LAB — T4, FREE: Free T4: 1.21 ng/dL (ref 0.60–1.60)

## 2022-01-23 LAB — TSH: TSH: 1.07 u[IU]/mL (ref 0.35–5.50)

## 2022-01-23 MED ORDER — LEVOTHYROXINE SODIUM 88 MCG PO TABS: 88.0000 ug | ORAL_TABLET | Freq: Every day | ORAL | 3 refills | Status: DC

## 2022-01-23 NOTE — Patient Instructions (Addendum)
Please stop at the lab.  Please continue Levothyroxine 88 mcg daily.  Take the thyroid hormone every day, with water, at least 30 minutes before breakfast, separated by at least 4 hours from: - acid reflux medications - calcium - iron - multivitamins  Please come back for a follow-up appointment in 1 year.

## 2022-01-23 NOTE — Progress Notes (Addendum)
Patient ID: Deborah Yates, female   DOB: [birth year], 61 y.o.   MRN: [id number]   HPI  Deborah Yates is a 61 y.o.-year-old female, presenting for follow-up for multiple thyroid nodules and hypothyroidism.  Last visit 1 year ago.  Interim history: At this visit, she feels well, without complaints.   She denies increased fatigue, tremors, palpitations. She twisted her ankle in 07/2021 >> gained 7 lbs as she was sedentary for 2 months.  Reviewed history: She was dx with MNG in her 99s. Started taking Synthroid 100 mcg in mid-20s to suppress the growth of the nodules.  She continues on this.  She was having regular thyroid ultrasounds in the past.  Thyroid ultrasound (04/14/2012):  R mid pole nodule 2.2 x 1.6 x 1.2 cm, previously 15 x 9 x 8 mm.  Multiple isthmic nodules, largest 2.1 cm in the right side of the isthmus, previously 9 x 7 x 4 mm (per radiologist, this nodule did not change much in size, upon review of the new and old images) L superior pole complex nodule 3.5 x 2.6 x 2.6 cm, previously 4.1 x 2.8 x 2.6 cm L inferior complex pole nodule 3.5 x 3.2 x 2.2 cm, previously 2.9 x 2.5 x 1.8 cm  Lymphadenopathy: None visualized.   Thyroid ultrasound (11/25/2015): IMPRESSION: 1. Thyromegaly with bilateral nodules. 2. Recommend FNA biopsy of 4 cm left mid nodule. 3. Recommend 1 year follow-up ultrasound of 2.4 cm right mid nodule.    Adequacy Reason Satisfactory For Evaluation. Diagnosis THYROID, FINE NEEDLE ASPIRATION LEFT, LMP, (SPECIMEN 1OF 1, COLLECTED ON 01/14/16 CONSISTENT WITH benign follicular nodule ([name] MD Pathologist, Electronic Signature (Case signed 01/15/2016) Specimen Clinical Information Left Mid, 4 x 1.6 x 2cm, mixed cystic and solid isoechoic, TI-RADS points 3 Source Thyroid, Fine Needle Aspiration, Left, LMP, (Specimen 1 of 1, collected on 01/14/16)  Thyroid U/S (05/08/2019): Parenchymal Echotexture: Markedly  heterogenous Isthmus: 0.3 cm Right lobe: 3.4 x 1 x 1.6 cm Left lobe: 5.4 x 1.7 x 2.1 cm  _______________________________________   Estimated total number of nodules >/= 1 cm: 3 ________________________________________   Nodule # 1: Prior biopsy: N  Location: Right; Mid Maximum size: 1.7 cm; Other 2 dimensions: 1.4 x 0.8 cm, previously, 2.4 x 1 x 1.6 cm cm Composition: solid/almost completely solid (2) Echogenicity: isoechoic (1) Change in features: Yes **Given size (>/= 1.5 cm) and appearance, fine needle aspiration of this moderately suspicious nodule should be considered based on TI-RADS criteria. However, this thyroid nodule has significantly decreased in size since the prior study, likely secondary to the loss of its cystic component. This is favored to represent a benign nodule given its stability across multiple prior studies. Additionally, it size is essentially stable since 2014.  _________________________________________________________   Nodule # 2: Prior biopsy: Yes Location: Left; Mid Maximum size: 3.2 cm; Other 2 dimensions: 1.6 x 1.5 cm, previously, 4 x 1.6 x 2 cm Composition: mixed cystic and solid (1)  Echogenicity: isoechoic (1) Echogenic foci: macrocalcifications (1) This thyroid nodule was previously biopsied. _________________________________________________________   Nodule # 3:  Prior biopsy: No Location: Left; Inferior Maximum size: 1.7 cm; Other 2 dimensions: 1.6 x 1.2 cm, previously, 1.9 x 1.1 x 1.7 cm cm Composition: solid/almost completely solid (2)  Echogenicity: isoechoic (1) Change in features: Yes *Given size (>/= 1.5 - 2.4 cm) and appearance, a follow-up ultrasound in 1 year should be considered based on TI-RADS criteria.  _________________________________________________________   There is a  0.8 cm cystic nodule in the inferior left thyroid gland. No worrisome cervical lymph nodes were identified on this study.   IMPRESSION: 1.  Multinodular thyroid gland as detailed above. 2. The dominant right-sided thyroid nodule has decreased in size from the prior study, consistent with a benign process. No further follow-up is required for this thyroid nodule. 3. The dominant left-sided thyroid nodule measuring 3.2 cm has significantly decreased in size since the prior study. This thyroid nodule was previously biopsied. Correlation with prior biopsy results is recommended. 4. Interval decrease in size of the 1.7 cm thyroid nodule as detailed above. This thyroid nodule is essentially unchanged since 2014. No further follow-up is required.  At last visit, we ordered another ultrasound but she did not have a chance to have this done yet.  Pt is on levothyroxine 88 mcg daily, taken: - in am - fasting - at least 30 min from b'fast - no calcium - no iron - + multivitamins - at night, occasionally - no PPIs - not on Biotin  Reviewed her TFTs: Lab Results  Component Value Date   TSH 0.68 06/10/2021   TSH 3.37 03/18/2021   TSH 0.28 (L) 01/23/2021   TSH 5.25 (H) 01/18/2020   TSH 1.86 01/23/2019   TSH 2.32 01/12/2018   TSH 0.28 (L) 02/10/2017   TSH 0.94 01/08/2016   TSH 10.34 (H) 10/29/2015   TSH 0.45 05/02/2015   FREET4 1.13 06/10/2021   FREET4 1.11 03/18/2021   FREET4 1.50 01/23/2021   FREET4 1.10 01/18/2020   FREET4 1.23 01/23/2019   FREET4 1.02 01/12/2018   FREET4 1.28 02/10/2017   FREET4 1.02 01/08/2016   FREET4 0.50 (L) 10/29/2015   FREET4 0.69 10/23/2014    Pt denies: - feeling nodules in neck - hoarseness - dysphagia - choking  No FH of thyroid cancer. No h/o radiation tx to head or neck. No herbal supplements. No Biotin use. No recent steroids use.   She  continues swimming for exercise. She also has osteopenia >> started Evista.  She is also on vitamin D. She has a history of microscopic hematuria.  She sees urology.  A CT abdomen and further investigation were negative for any pathology.  She  is asymptomatic.  ROS: + see HPI  I reviewed pt's medications, allergies, PMH, social hx, family hx, and changes were documented in the history of present illness. Otherwise, unchanged from my initial visit note.  Past Medical History:  Diagnosis Date   Goiter    remote, u/s 2007, repeated 05/2007- stable   Past Surgical History:  Procedure Laterality Date   SKIN CANCER EXCISION     removed rt arm/rt leg 2008- sees derm 4 times a year   Social History   Social History   Marital status: Divorced    Spouse name: N/A   Number of children: 1   Occupational History   Software engineer    [employer]   Social History Main Topics   Smoking status: Never Smoker   Smokeless tobacco: Never Used   Alcohol use Yes     Comment: socially, Once a month    Drug use: no   Social History Narrative   Very healthy life style   Current Outpatient Medications on File Prior to Visit  Medication Sig Dispense Refill   levothyroxine (SYNTHROID) 88 MCG tablet TAKE ONE TABLET BY MOUTH DAILY 90 tablet 0   No current facility-administered medications on file prior to visit.   No Known Allergies Family History  Problem Relation Age of Onset   Atrial fibrillation Mother 60   Colon cancer Brother 73   Breast cancer Maternal Grandmother 82   Coronary artery disease Neg Hx    Hypertension Neg Hx    Stroke Neg Hx    Diabetes Neg Hx    PE: BP 100/66 (BP Location: Left Arm, Patient Position: Sitting, Cuff Size: Normal)   Pulse 65   Ht '5\' 7"'$  (1.702 m)   Wt 143 lb (64.9 kg)   LMP 04/13/2012   SpO2 98%   BMI 22.40 kg/m  Wt Readings from Last 3 Encounters:  01/23/22 143 lb (64.9 kg)  01/23/21 136 lb 12.8 oz (62.1 kg)  01/18/20 138 lb 12.8 oz (63 kg)   Constitutional: normal weight, in NAD Eyes: EOMI, no exophthalmos ENT: no thyromegaly or neck masses felt on palpation of her neck, no cervical lymphadenopathy Cardiovascular: RRR, No MRG Respiratory: CTA B Musculoskeletal: no  deformities Skin: no rashes Neurological: no tremor with outstretched hands  ASSESSMENT: 1. Multiple thyroid nodules  2. Hypothyroidism  PLAN: 1. Multiple thyroid nodules -Patient with history of low risk thyroid nodules.  She had previous biopsies of the left thyroid nodule, which appears to have increased in size, and the results were benign. -She has no family history of thyroid cancer or personal history of radiation therapy to increase her risk of thyroid cancer -I reviewed the results of her latest thyroid ultrasound from 05/08/2019.  Nodules appeared to be stable and one of them even decreased in size. -At last visit, I recommended another ultrasound and discussed that if the nodules were stable, to follow them clinically.  However, she did not have this ultrasound yet.  Will reorder it today. -No neck compression symptoms or masses felt on palpation of her neck today  2. Hypothyroidism - latest thyroid labs reviewed with pt. >> normal: Lab Results  Component Value Date   TSH 0.68 06/10/2021  - she continues on LT4 88 mcg daily (dose decreased at last visit, when the TSH returned slightly suppressed) - pt feels good on this dose. She gained 7 lbs since last OV due to being sedentary after she hurt her ankle. - we discussed about taking the thyroid hormone every day, with water, >30 minutes before breakfast, separated by >4 hours from acid reflux medications, calcium, iron, multivitamins. Pt. is taking it correctly. - will check thyroid tests today: TSH and fT4 - If labs are abnormal, she will need to return for repeat TFTs in 1.5 months - OTW, I will see her back in a year  Needs refills.  + Flu shot today  Component     Latest Ref Rng 01/23/2022  TSH     0.35 - 5.50 uIU/mL 1.07   T4,Free(Direct)     0.60 - 1.60 ng/dL 1.21   Tests are normal.  Will refill her levothyroxine.  Thyroid U/S (02/05/2022): Parenchymal Echotexture: Markedly heterogenous  Isthmus: 0.3 cm   Right lobe: 3.5 x 0.9 x 1.4 cm  Left lobe: 5.4 x 1.6 x 2.0 cm  _________________________________________________________   Estimated total number of nodules >/= 1 cm: 3 _________________________________________________________   Nodule # 1: Continued stability of right lower pole thyroid nodule. Greater than 5 year stability remains consistent with benignity.   Nodule # 2: No significant interval change in the size or appearance of previously biopsied nodule in the left mid gland. Greater than 5 year stability is consistent with benignity.   Nodule # 3: Greater than 5 year  stability of left inferior thyroid nodule which remains benign.   No new nodules or suspicious features.   IMPRESSION: Continued stability in the appearance of the thyroid gland and thyroid nodules which have been stable for more than 5 years. All nodules are consistent with benign processes.   No new nodules or suspicious features identified.   Philemon Kingdom, MD PhD [facility] Endocrinology

## 2022-02-05 ENCOUNTER — Ambulatory Visit
Admission: RE | Admit: 2022-02-05 | Discharge: 2022-02-05 | Disposition: A | Discharge status: Home / Self Care | Payer: BC Managed Care – PPO | Source: Ambulatory Visit | Attending: Internal Medicine | Admitting: Internal Medicine

## 2022-02-05 DIAGNOSIS — E042 Nontoxic multinodular goiter: Secondary | ICD-10-CM

## 2022-05-13 ENCOUNTER — Encounter: Payer: Self-pay | Admitting: Internal Medicine

## 2023-01-25 ENCOUNTER — Ambulatory Visit (INDEPENDENT_AMBULATORY_CARE_PROVIDER_SITE_OTHER): Payer: BC Managed Care – PPO | Admitting: Internal Medicine

## 2023-01-25 ENCOUNTER — Encounter: Payer: Self-pay | Admitting: Internal Medicine

## 2023-01-25 VITALS — BP 120/60 | HR 65 | Ht 67.0 in | Wt 138.8 lb

## 2023-01-25 DIAGNOSIS — Z23 Encounter for immunization: Secondary | ICD-10-CM

## 2023-01-25 DIAGNOSIS — E042 Nontoxic multinodular goiter: Secondary | ICD-10-CM | POA: Diagnosis not present

## 2023-01-25 DIAGNOSIS — E039 Hypothyroidism, unspecified: Secondary | ICD-10-CM

## 2023-01-25 NOTE — Patient Instructions (Signed)
Please stop at the lab.  Please continue Levothyroxine 88 mcg daily.  Take the thyroid hormone every day, with water, at least 30 minutes before breakfast, separated by at least 4 hours from: - acid reflux medications - calcium - iron - multivitamins  Please come back for a follow-up appointment in 1 year. 

## 2023-01-25 NOTE — Progress Notes (Signed)
Patient ID: Deborah Yates, female   DOB: 11-26-1960, 62 y.o.   MRN: [id number]   HPI  Deborah Yates is a 62 y.o.-year-old female, presenting for follow-up for multiple thyroid nodules and hypothyroidism.  Last visit 1 year ago.  Interim history: At this visit, she feels well, without complaints.  No increased fatigue, tremors, palpitations. Before last visit she gained 7 pounds that she was sedentary for 2 months after twisting her ankle in 07/2021. She lost 5 lbs since then. She had a perforated colon 05/2022. She had to have surgery (colon resection - 9 h  surgery) - in 07/2022.  She recovered well afterwards. She retired since last OV.  She is painting now.  Reviewed history: She was dx with MNG in her 14s. Started taking Synthroid 100 mcg in mid-20s to suppress the growth of the nodules.  She continues on this.  She was having regular thyroid ultrasounds in the past.  Thyroid ultrasound (04/14/2012):  R mid pole nodule 2.2 x 1.6 x 1.2 cm, previously 15 x 9 x 8 mm.  Multiple isthmic nodules, largest 2.1 cm in the right side of the isthmus, previously 9 x 7 x 4 mm (per radiologist, this nodule did not change much in size, upon review of the new and old images) L superior pole complex nodule 3.5 x 2.6 x 2.6 cm, previously 4.1 x 2.8 x 2.6 cm L inferior complex pole nodule 3.5 x 3.2 x 2.2 cm, previously 2.9 x 2.5 x 1.8 cm  Lymphadenopathy: None visualized.   Thyroid ultrasound (11/25/2015): 1. Thyromegaly with bilateral nodules. 2. Recommend FNA biopsy of 4 cm left mid nodule. 3. Recommend 1 year follow-up ultrasound of 2.4 cm right mid nodule.    Adequacy Reason Satisfactory For Evaluation. Diagnosis THYROID, FINE NEEDLE ASPIRATION LEFT, LMP, (SPECIMEN 1OF 1, COLLECTED ON 01/14/16 CONSISTENT WITH benign follicular nodule (BETHESDA CATEGORY II).  Thyroid U/S (05/08/2019): Parenchymal Echotexture: Markedly heterogenous Isthmus: 0.3 cm Right lobe: 3.4 x 1 x 1.6 cm Left lobe:  5.4 x 1.7 x 2.1 cm  _______________________________________   Estimated total number of nodules >/= 1 cm: 3 ________________________________________   Nodule # 1: Prior biopsy: N  Location: Right; Mid Maximum size: 1.7 cm; Other 2 dimensions: 1.4 x 0.8 cm, previously, 2.4 x 1 x 1.6 cm cm Composition: solid/almost completely solid (2) Echogenicity: isoechoic (1) Change in features: Yes **Given size (>/= 1.5 cm) and appearance, fine needle aspiration of this moderately suspicious nodule should be considered based on TI-RADS criteria. However, this thyroid nodule has significantly decreased in size since the prior study, likely secondary to the loss of its cystic component. This is favored to represent a benign nodule given its stability across multiple prior studies. Additionally, it size is essentially stable since 2014.  _________________________________________________________   Nodule # 2: Prior biopsy: Yes Location: Left; Mid Maximum size: 3.2 cm; Other 2 dimensions: 1.6 x 1.5 cm, previously, 4 x 1.6 x 2 cm Composition: mixed cystic and solid (1)  Echogenicity: isoechoic (1) Echogenic foci: macrocalcifications (1) This thyroid nodule was previously biopsied. _________________________________________________________   Nodule # 3:  Prior biopsy: No Location: Left; Inferior Maximum size: 1.7 cm; Other 2 dimensions: 1.6 x 1.2 cm, previously, 1.9 x 1.1 x 1.7 cm cm Composition: solid/almost completely solid (2)  Echogenicity: isoechoic (1) Change in features: Yes *Given size (>/= 1.5 - 2.4 cm) and appearance, a follow-up ultrasound in 1 year should be considered based on TI-RADS criteria.  _________________________________________________________   There is  a 0.8 cm cystic nodule in the inferior left thyroid gland. No worrisome cervical lymph nodes were identified on this study.   IMPRESSION: 1. Multinodular thyroid gland as detailed above. 2. The dominant right-sided  thyroid nodule has decreased in size from the prior study, consistent with a benign process. No further follow-up is required for this thyroid nodule. 3. The dominant left-sided thyroid nodule measuring 3.2 cm has significantly decreased in size since the prior study. This thyroid nodule was previously biopsied. Correlation with prior biopsy results is recommended. 4. Interval decrease in size of the 1.7 cm thyroid nodule as detailed above. This thyroid nodule is essentially unchanged since 2014. No further follow-up is required.  Thyroid U/S (02/05/2022): Parenchymal Echotexture: Markedly heterogenous  Isthmus: 0.3 cm  Right lobe: 3.5 x 0.9 x 1.4 cm  Left lobe: 5.4 x 1.6 x 2.0 cm  _________________________________________________________   Estimated total number of nodules >/= 1 cm: 3 _________________________________________________________   Nodule # 1: Continued stability of right lower pole thyroid nodule. Greater than 5 year stability remains consistent with benignity.   Nodule # 2: No significant interval change in the size or appearance of previously biopsied nodule in the left mid gland. Greater than 5 year stability is consistent with benignity.   Nodule # 3: Greater than 5 year stability of left inferior thyroid nodule which remains benign.   No new nodules or suspicious features.   IMPRESSION: Continued stability in the appearance of the thyroid gland and thyroid nodules which have been stable for more than 5 years. All nodules are consistent with benign processes.   No new nodules or suspicious features identified.  Pt is on levothyroxine 88 mcg daily, taken: - in am - fasting - at least 30 min from b'fast - no calcium - no iron - + multivitamins - later in the day, occasionally - no PPIs - not on Biotin  Reviewed her TFTs: Lab Results  Component Value Date   TSH 1.07 01/23/2022   TSH 0.68 06/10/2021   TSH 3.37 03/18/2021   TSH 0.28 (L) 01/23/2021    TSH 5.25 (H) 01/18/2020   TSH 1.86 01/23/2019   TSH 2.32 01/12/2018   TSH 0.28 (L) 02/10/2017   TSH 0.94 01/08/2016   TSH 10.34 (H) 10/29/2015   FREET4 1.21 01/23/2022   FREET4 1.13 06/10/2021   FREET4 1.11 03/18/2021   FREET4 1.50 01/23/2021   FREET4 1.10 01/18/2020   FREET4 1.23 01/23/2019   FREET4 1.02 01/12/2018   FREET4 1.28 02/10/2017   FREET4 1.02 01/08/2016   FREET4 0.50 (L) 10/29/2015    Pt denies: - feeling nodules in neck - hoarseness - dysphagia - choking  No FH of thyroid cancer. No h/o radiation tx to head or neck. No herbal supplements. No Biotin use. No recent steroids use.   She swims for exercise. She also has osteopenia >> started Evista.  She is also on vitamin D. She has a history of microscopic hematuria.  She sees urology.  A CT abdomen and further investigation were negative for any pathology.  She is asymptomatic.  ROS: + see HPI  I reviewed pt's medications, allergies, PMH, social hx, family hx, and changes were documented in the history of present illness. Otherwise, unchanged from my initial visit note.  Past Medical History:  Diagnosis Date   Goiter    remote, u/s 2007, repeated 05/2007- stable   Past Surgical History:  Procedure Laterality Date   SKIN CANCER EXCISION     removed rt  arm/rt leg 2008- sees derm 4 times a year   Social History   Social History   Marital status: Divorced    Spouse name: N/A   Number of children: 1   Occupational History   Engineer, manufacturing [employer]   Social History Main Topics   Smoking status: Never Smoker   Smokeless tobacco: Never Used   Alcohol use Yes     Comment: socially, Once a month    Drug use: no   Social History Narrative   Very healthy life style   Current Outpatient Medications on File Prior to Visit  Medication Sig Dispense Refill   levothyroxine (SYNTHROID) 88 MCG tablet Take 1 tablet (88 mcg total) by mouth daily. 90 tablet 3   No current facility-administered  medications on file prior to visit.   No Known Allergies Family History  Problem Relation Age of Onset   Atrial fibrillation Mother 17   Colon cancer Brother 69   Breast cancer Maternal Grandmother 67   Coronary artery disease Neg Hx    Hypertension Neg Hx    Stroke Neg Hx    Diabetes Neg Hx    PE: BP 120/60   Pulse 65   Ht 5\' 7"  (1.702 m)   Wt 138 lb 12.8 oz (63 kg)   LMP 04/13/2012   SpO2 99%   BMI 21.74 kg/m  Wt Readings from Last 3 Encounters:  01/25/23 138 lb 12.8 oz (63 kg)  01/23/22 143 lb (64.9 kg)  01/23/21 136 lb 12.8 oz (62.1 kg)   Constitutional: normal weight, in NAD Eyes: EOMI, no exophthalmos ENT: no thyromegaly or neck masses felt on palpation of her neck, no cervical lymphadenopathy Cardiovascular: RRR, No MRG Respiratory: CTA B Musculoskeletal: no deformities Skin: no rashes Neurological: no tremor with outstretched hands  ASSESSMENT: 1. Multiple thyroid nodules  2. Hypothyroidism  PLAN: 1. Multiple thyroid nodules -Patient with history of thyroid nodules.  She had previous biopsies of the left thyroid nodule, which appears to have increased in size, and the results were benign -She has a family history of thyroid cancer or personal history of radiation therapy to head or neck to increase her risk of thyroid cancer -Reviewed the ultrasound from 05/08/2019: Nodules were stable and one of them even decreased in size. -At last visit we ordered another ultrasound and this was done on 02/05/2022.  Nodules were stable and no further imaging follow-up was recommended. -She denies neck compression symptoms or masses felt on palpation of her neck today -Will continue to follow her clinically for this  2. Hypothyroidism - latest thyroid labs reviewed with pt. >> normal: Lab Results  Component Value Date   TSH 1.07 01/23/2022  - she continues on LT4 88 mcg daily - pt feels good on this dose. - we discussed about taking the thyroid hormone every day, with  water, >30 minutes before breakfast, separated by >4 hours from acid reflux medications, calcium, iron, multivitamins. Pt. is taking it correctly. - will check thyroid tests today: TSH and fT4 - If labs are abnormal, she will need to return for repeat TFTs in 1.5 months - OTW, I will see her back in a year  Needs refills.  + flu shot today.  Component     Latest Ref Rng 01/25/2023  T4,Free(Direct)     0.8 - 1.8 ng/dL 1.5   TSH     2.37 - 6.28 mIU/L 1.58   Normal TFTs.   Carlus Pavlov, MD PhD   Endocrinology

## 2023-01-26 LAB — TSH: TSH: 1.58 m[IU]/L (ref 0.40–4.50)

## 2023-01-26 LAB — T4, FREE: Free T4: 1.5 ng/dL (ref 0.8–1.8)

## 2023-01-26 MED ORDER — LEVOTHYROXINE SODIUM 88 MCG PO TABS: 88.0000 ug | ORAL_TABLET | Freq: Every day | ORAL | 3 refills | Status: DC

## 2024-01-25 ENCOUNTER — Encounter: Payer: Self-pay | Admitting: Internal Medicine

## 2024-01-25 ENCOUNTER — Ambulatory Visit: Payer: BC Managed Care – PPO | Admitting: Internal Medicine

## 2024-01-25 ENCOUNTER — Other Ambulatory Visit

## 2024-01-25 VITALS — BP 120/70 | HR 72 | Ht 67.0 in | Wt 145.0 lb

## 2024-01-25 DIAGNOSIS — E039 Hypothyroidism, unspecified: Secondary | ICD-10-CM

## 2024-01-25 DIAGNOSIS — E042 Nontoxic multinodular goiter: Secondary | ICD-10-CM

## 2024-01-25 DIAGNOSIS — Z23 Encounter for immunization: Secondary | ICD-10-CM

## 2024-01-25 NOTE — Patient Instructions (Signed)
Please stop at the lab.  Please continue Levothyroxine 88 mcg daily.  Take the thyroid hormone every day, with water, at least 30 minutes before breakfast, separated by at least 4 hours from: - acid reflux medications - calcium - iron - multivitamins  Please come back for a follow-up appointment in 1 year. 

## 2024-01-25 NOTE — Progress Notes (Signed)
 Patient ID: Deborah Yates, female   DOB: [birth year], 63 y.o.   MRN: [id number]   HPI  Deborah Yates is a 63 y.o.-year-old female, presenting for follow-up for multiple thyroid  nodules and hypothyroidism.  Last visit 1 year ago.  Interim history: At this visit, she feels well, without complaints.  No increased fatigue, tremors, palpitations. She retired before last OV.  She is painting - mostly landscapes.  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 Started taking Synthroid  100 mcg in mid-20s to suppress the growth of the nodules.  She continues on this.  She was having regular thyroid  ultrasounds in the past.  Thyroid  ultrasound (04/14/2012):  R mid pole nodule 2.2 x 1.6 x 1.2 cm, previously 15 x 9 x 8 mm.  Multiple isthmic nodules, largest 2.1 cm in the right side of the isthmus, previously 9 x 7 x 4 mm (per radiologist, this nodule did not change much in size, upon review of the new and old images) L superior pole complex nodule 3.5 x 2.6 x 2.6 cm, previously 4.1 x 2.8 x 2.6 cm L inferior complex pole nodule 3.5 x 3.2 x 2.2 cm, previously 2.9 x 2.5 x 1.8 cm  Lymphadenopathy: None visualized.   Thyroid  ultrasound (11/25/2015): 1. Thyromegaly with bilateral nodules. 2. Recommend FNA biopsy of 4 cm left mid nodule. 3. Recommend 1 year follow-up ultrasound of 2.4 cm right mid nodule.    Adequacy Reason Satisfactory For Evaluation. Diagnosis THYROID , FINE NEEDLE ASPIRATION LEFT, LMP, (SPECIMEN 1OF 1, COLLECTED ON 01/14/16 CONSISTENT WITH benign follicular nodule (BETHESDA CATEGORY II).  Thyroid  U/S (05/08/2019): Parenchymal Echotexture: Markedly heterogenous Isthmus: 0.3 cm Right lobe: 3.4 x 1 x 1.6 cm Left lobe: 5.4 x 1.7 x 2.1 cm  _______________________________________   Estimated total number of nodules >/= 1 cm: 3 ________________________________________   Nodule # 1: Prior biopsy: N  Location: Right; Mid Maximum size: 1.7 cm; Other 2 dimensions: 1.4 x 0.8  cm, previously, 2.4 x 1 x 1.6 cm cm Composition: solid/almost completely solid (2) Echogenicity: isoechoic (1) Change in features: Yes **Given size (>/= 1.5 cm) and appearance, fine needle aspiration of this moderately suspicious nodule should be considered based on TI-RADS criteria. However, this thyroid  nodule has significantly decreased in size since the prior study, likely secondary to the loss of its cystic component. This is favored to represent a benign nodule given its stability across multiple prior studies. Additionally, it size is essentially stable since 2014.  _________________________________________________________   Nodule # 2: Prior biopsy: Yes Location: Left; Mid Maximum size: 3.2 cm; Other 2 dimensions: 1.6 x 1.5 cm, previously, 4 x 1.6 x 2 cm Composition: mixed cystic and solid (1)  Echogenicity: isoechoic (1) Echogenic foci: macrocalcifications (1) This thyroid  nodule was previously biopsied. _________________________________________________________   Nodule # 3:  Prior biopsy: No Location: Left; Inferior Maximum size: 1.7 cm; Other 2 dimensions: 1.6 x 1.2 cm, previously, 1.9 x 1.1 x 1.7 cm cm Composition: solid/almost completely solid (2)  Echogenicity: isoechoic (1) Change in features: Yes *Given size (>/= 1.5 - 2.4 cm) and appearance, a follow-up ultrasound in 1 year should be considered based on TI-RADS criteria.  _________________________________________________________   There is a 0.8 cm cystic nodule in the inferior left thyroid  gland. No worrisome cervical lymph nodes were identified on this study.   IMPRESSION: 1. Multinodular thyroid  gland as detailed above. 2. The dominant right-sided thyroid  nodule has decreased in size from the prior study, consistent with a benign process.  No further follow-up is required for this thyroid  nodule. 3. The dominant left-sided thyroid  nodule measuring 3.2 cm has significantly decreased in size since the prior  study. This thyroid  nodule was previously biopsied. Correlation with prior biopsy results is recommended. 4. Interval decrease in size of the 1.7 cm thyroid  nodule as detailed above. This thyroid  nodule is essentially unchanged since 2014. No further follow-up is required.  Thyroid  U/S (02/05/2022): Parenchymal Echotexture: Markedly heterogenous  Isthmus: 0.3 cm  Right lobe: 3.5 x 0.9 x 1.4 cm  Left lobe: 5.4 x 1.6 x 2.0 cm  _________________________________________________________   Estimated total number of nodules >/= 1 cm: 3 _________________________________________________________   Nodule # 1: Continued stability of right lower pole thyroid  nodule. Greater than 5 year stability remains consistent with benignity.   Nodule # 2: No significant interval change in the size or appearance of previously biopsied nodule in the left mid gland. Greater than 5 year stability is consistent with benignity.   Nodule # 3: Greater than 5 year stability of left inferior thyroid  nodule which remains benign.   No new nodules or suspicious features.   IMPRESSION: Continued stability in the appearance of the thyroid  gland and thyroid  nodules which have been stable for more than 5 years. All nodules are consistent with benign processes.   No new nodules or suspicious features identified.  Pt is on levothyroxine  88 mcg daily, taken: - in am - fasting - at least 30 min from b'fast - no calcium - no iron - + multivitamins - later in the day, occasionally - no PPIs - not on Biotin  Reviewed her TFTs: Lab Results  Component Value Date   TSH 1.58 01/25/2023   TSH 1.07 01/23/2022   TSH 0.68 06/10/2021   TSH 3.37 03/18/2021   TSH 0.28 (L) 01/23/2021   TSH 5.25 (H) 01/18/2020   TSH 1.86 01/23/2019   TSH 2.32 01/12/2018   TSH 0.28 (L) 02/10/2017   TSH 0.94 01/08/2016   FREET4 1.5 01/25/2023   FREET4 1.21 01/23/2022   FREET4 1.13 06/10/2021   FREET4 1.11 03/18/2021   FREET4 1.50  01/23/2021   FREET4 1.10 01/18/2020   FREET4 1.23 01/23/2019   FREET4 1.02 01/12/2018   FREET4 1.28 02/10/2017   FREET4 1.02 01/08/2016    Pt denies: - feeling nodules in neck - hoarseness - dysphagia - choking  No FH of thyroid  cancer. No h/o radiation tx to head or neck. No herbal supplements. No Biotin use. No recent steroids use.   She swims for exercise. She also has osteopenia >> started Evista.  She is also on vitamin D . She has a history of microscopic hematuria.  She sees urology.  A CT abdomen and further investigation were negative for any pathology.  She is asymptomatic.  ROS: + see HPI  I reviewed pt's medications, allergies, PMH, social hx, family hx, and changes were documented in the history of present illness. Otherwise, unchanged from my initial visit note.  Past Medical History:  Diagnosis Date   Goiter    remote, u/s 2007, repeated 05/2007- stable   Past Surgical History:  Procedure Laterality Date   SKIN CANCER EXCISION     removed rt arm/rt leg 2008- sees derm 4 times a year   Social History   Social History   Marital status: Divorced    Spouse name: N/A   Number of children: 1   Occupational History   teacher [employer]   Social History Main  Topics   Smoking status: Never Smoker   Smokeless tobacco: Never Used   Alcohol use Yes     Comment: socially, Once a month    Drug use: no   Social History Narrative   Very healthy life style   Current Outpatient Medications on File Prior to Visit  Medication Sig Dispense Refill   levothyroxine  (SYNTHROID ) 88 MCG tablet Take 1 tablet (88 mcg total) by mouth daily. 90 tablet 3   No current facility-administered medications on file prior to visit.   No Known Allergies Family History  Problem Relation Age of Onset   Atrial fibrillation Mother 94   Colon cancer Brother 86   Breast cancer Maternal Grandmother 32   Coronary artery disease Neg Hx    Hypertension Neg Hx    Stroke  Neg Hx    Diabetes Neg Hx    PE: BP 120/70   Pulse 72   Ht 5' 7 (1.702 m)   Wt 145 lb (65.8 kg)   LMP 04/13/2012   SpO2 96%   BMI 22.71 kg/m  Wt Readings from Last 3 Encounters:  01/25/24 145 lb (65.8 kg)  01/25/23 138 lb 12.8 oz (63 kg)  01/23/22 143 lb (64.9 kg)   Constitutional: normal weight, in NAD Eyes: EOMI, no exophthalmos ENT: no thyromegaly or neck masses felt on palpation of her neck, no cervical lymphadenopathy Cardiovascular: RRR, No MRG Respiratory: CTA B Musculoskeletal: no deformities Skin: no rashes Neurological: no tremor with outstretched hands  ASSESSMENT: 1. Multiple thyroid  nodules  2. Hypothyroidism  PLAN: 1. Multiple thyroid  nodules - She has a history of several thyroid  nodules, with previous biopsies of the left thyroid  nodule, with benign results.    - No family history of thyroid  cancer or personal history of radiation therapy to head or neck to increase the risk of thyroid  cancer - On the ultrasound from 04/2019, nodules were stable and one of them even decreased in size - Latest ultrasound is from 02/05/2022.  Nodules were stable and no further imaging follow-up was recommended.    - No neck compression symptoms or masses felt on palpation of her neck today - We will continue to follow her clinically for this  2. Hypothyroidism - latest thyroid  labs reviewed with pt. >> normal: Lab Results  Component Value Date   TSH 1.58 01/25/2023  - she continues on LT4 88 mcg daily - pt feels good on this dose. - we discussed about taking the thyroid  hormone every day, with water, >30 minutes before breakfast, separated by >4 hours from acid reflux medications, calcium, iron, multivitamins. Pt. is taking it correctly. - will check thyroid  tests today: TSH and fT4 - If labs are abnormal, she will need to return for repeat TFTs in 1.5 months - OTW, I will see her back in a year  Needs refills.  + Flu shot today.  Component     Latest Ref Rng  01/25/2024  TSH     0.40 - 4.50 mIU/L 2.15   T4,Free(Direct)     0.8 - 1.8 ng/dL 1.4     Lela Fendt, MD PhD [facility]

## 2024-01-26 ENCOUNTER — Ambulatory Visit: Payer: Self-pay | Admitting: Internal Medicine

## 2024-01-26 LAB — TSH: TSH: 2.15 m[IU]/L (ref 0.40–4.50)

## 2024-01-26 LAB — T4, FREE: Free T4: 1.4 ng/dL (ref 0.8–1.8)

## 2024-01-26 MED ORDER — LEVOTHYROXINE SODIUM 88 MCG PO TABS: 88.0000 ug | ORAL_TABLET | Freq: Every day | ORAL | 3 refills | Status: AC

## 2024-01-26 NOTE — Addendum Note (Signed)
 Addended by: TRIXIE FILE on: 01/26/2024 09:58 AM   Modules accepted: Orders

## 2025-01-24 ENCOUNTER — Ambulatory Visit: Admitting: Internal Medicine
